# Patient Record
Sex: Female | Born: 1955
Health system: Southern US, Community
[De-identification: ages and names within clinical notes are randomized; demographics above are authoritative.]

## PROBLEM LIST (undated history)

## (undated) DIAGNOSIS — E041 Nontoxic single thyroid nodule: Secondary | ICD-10-CM

## (undated) DIAGNOSIS — K589 Irritable bowel syndrome without diarrhea: Secondary | ICD-10-CM

## (undated) DIAGNOSIS — J309 Allergic rhinitis, unspecified: Secondary | ICD-10-CM

## (undated) DIAGNOSIS — G4700 Insomnia, unspecified: Secondary | ICD-10-CM

## (undated) DIAGNOSIS — C55 Malignant neoplasm of uterus, part unspecified: Secondary | ICD-10-CM

## (undated) DIAGNOSIS — K610 Anal abscess: Secondary | ICD-10-CM

## (undated) HISTORY — DX: Anal abscess: K61.0

## (undated) HISTORY — DX: Insomnia, unspecified: G47.00

## (undated) HISTORY — PX: ABDOMINAL HYSTERECTOMY: SHX81

## (undated) HISTORY — DX: Irritable bowel syndrome, unspecified: K58.9

## (undated) HISTORY — DX: Nontoxic single thyroid nodule: E04.1

## (undated) HISTORY — DX: Malignant neoplasm of uterus, part unspecified: C55

## (undated) HISTORY — PX: COLONOSCOPY: SHX174

## (undated) HISTORY — DX: Allergic rhinitis, unspecified: J30.9

## (undated) HISTORY — PX: HEMORRHOID SURGERY: SHX153

---

## 1999-05-03 ENCOUNTER — Ambulatory Visit (HOSPITAL_COMMUNITY): Admission: RE | Admit: 1999-05-03 | Discharge: 1999-05-03 | Payer: Self-pay | Admitting: Internal Medicine

## 1999-05-03 ENCOUNTER — Encounter: Payer: Self-pay | Admitting: Internal Medicine

## 2000-04-06 ENCOUNTER — Encounter (INDEPENDENT_AMBULATORY_CARE_PROVIDER_SITE_OTHER): Payer: Self-pay | Admitting: Specialist

## 2000-04-06 ENCOUNTER — Ambulatory Visit (HOSPITAL_COMMUNITY): Admission: RE | Admit: 2000-04-06 | Discharge: 2000-04-06 | Payer: Self-pay | Admitting: General Surgery

## 2000-06-28 ENCOUNTER — Other Ambulatory Visit: Admission: RE | Admit: 2000-06-28 | Discharge: 2000-06-28 | Payer: Self-pay | Admitting: *Deleted

## 2000-10-27 ENCOUNTER — Encounter (INDEPENDENT_AMBULATORY_CARE_PROVIDER_SITE_OTHER): Payer: Self-pay | Admitting: Specialist

## 2000-10-27 ENCOUNTER — Ambulatory Visit (HOSPITAL_COMMUNITY): Admission: RE | Admit: 2000-10-27 | Discharge: 2000-10-27 | Payer: Self-pay | Admitting: General Surgery

## 2001-11-09 ENCOUNTER — Other Ambulatory Visit: Admission: RE | Admit: 2001-11-09 | Discharge: 2001-11-09 | Payer: Self-pay | Admitting: Obstetrics & Gynecology

## 2002-09-25 ENCOUNTER — Other Ambulatory Visit: Admission: RE | Admit: 2002-09-25 | Discharge: 2002-09-25 | Payer: Self-pay | Admitting: Pediatrics

## 2002-10-02 ENCOUNTER — Other Ambulatory Visit: Admission: RE | Admit: 2002-10-02 | Discharge: 2002-10-02 | Payer: Self-pay | Admitting: Diagnostic Radiology

## 2002-11-06 ENCOUNTER — Other Ambulatory Visit: Admission: RE | Admit: 2002-11-06 | Discharge: 2002-11-06 | Payer: Self-pay | Admitting: Obstetrics & Gynecology

## 2003-02-23 ENCOUNTER — Emergency Department (HOSPITAL_COMMUNITY): Admission: EM | Admit: 2003-02-23 | Discharge: 2003-02-23 | Payer: Self-pay | Admitting: Emergency Medicine

## 2003-07-22 ENCOUNTER — Encounter: Admission: RE | Admit: 2003-07-22 | Discharge: 2003-07-22 | Payer: Self-pay | Admitting: Internal Medicine

## 2006-11-16 ENCOUNTER — Encounter: Admission: RE | Admit: 2006-11-16 | Discharge: 2007-02-06 | Payer: Self-pay | Admitting: Obstetrics and Gynecology

## 2007-03-01 HISTORY — PX: CARPAL TUNNEL RELEASE: SHX101

## 2008-12-15 ENCOUNTER — Ambulatory Visit: Payer: Self-pay | Admitting: Internal Medicine

## 2008-12-29 ENCOUNTER — Telehealth (INDEPENDENT_AMBULATORY_CARE_PROVIDER_SITE_OTHER): Payer: Self-pay | Admitting: *Deleted

## 2010-07-16 NOTE — Op Note (Signed)
[facility]  Patient:    [name], [name]                MRN: [id number] Proc. Date: 04/06/00 Adm. Date:  [id number] Attending:  Carson Myrtle                           Operative Report  PREOPERATIVE DIAGNOSIS:  Internal and external hemorrhoids.  POSTOPERATIVE DIAGNOSIS:  Internal and external hemorrhoids.  OPERATION:  Simple hemorrhoidectomy.  SURGEON:  Timothy E. Earlene Plater, M.D.  ANESTHESIA:  Local with standby.  INDICATIONS:  Ms. Perlie Gold has a longstanding protruding hemorrhoid in the right anterior position causing pain and bleeding and she wishes to have that removed.  I carefully discussed in particular the anesthetic.  S  DESCRIPTION OF PROCEDURE:  She was prepared at home, brought into the hospital this morning, taken to room #1, placed supine, IV started and sedation given. She was then placed in the lithotomy.  Perineal area inspected.  She was prepped and draped in the usual fashion.  A single protruding hemorrhoid was present in the right anterior position.  This was injected locally with 0.25% Marcaine with epinephrine, mixed 9:1 with [place].  This was massaged in well and then the hemorrhoidal complex was removed in its entirety.  The wound was closed with carefully applied 2-0 chromic, closing the wound across the rectal mucosa, anoderm and skin.  There was no bleeding or complication.  The sphincter was intact.  There was no evidence of pathology.  The procedure was complete.  Counts were correct.  Gelfoam and dry sterile dressing were applied.  She tolerated it well and was removed to the recovery room in good condition.  Written and verbal instructions were given including Percocet 5/325 #24 and she will be followed as an outpatient.  DD:  04/06/00 TD:  04/06/00 Job: [id number]

## 2010-07-16 NOTE — Op Note (Signed)
[facility]  Patient:    [name], [name] Visit Number: [id number] MRN: [id number]          Service Type: DSU Location: DAY Attending Physician:  Carson Myrtle Proc. Date: 10/27/00 Admit Date:  10/27/2000                             Operative Report  PREOPERATIVE DIAGNOSIS:  Anal abscess.  POSTOPERATIVE DIAGNOSIS:  Anal abscess.  PROCEDURE:  Debridement of anal abscess.  SURGEON:  Timothy E. Earlene Plater, M.D.  ANESTHESIA:  Local standby  INDICATIONS:  Ms. [name] had a simple hemorrhoidectomy in February. Postoperatively she developed a perianal abscess that was treated conservatively, and she has delayed surgical treatment until this time.  She agrees and understands the procedure.  DESCRIPTION OF PROCEDURE:  She was taken to the operating room, placed supine, IV sedation given.  Placed in lithotomy.  The perianal area was inspected and prepped and draped in the usual fashion.  Marcaine 0.25% with epinephrine was used for local anesthesia.  The area of concern was the right anterior where the previous hemorrhoidectomy had been done.  There was a small intra-anal abscess.  This area was debrided.  The skin edges and mucosa were cut away, and the base then cauterized.  There was no fistula.  This completed the procedure.  Gelfoam, gauze, and a dry sterile dressing applied.  She tolerated it well and was moved to the recovery room in good condition.  Written and verbal instructions, including Percocet 5 mg #36 were given.  She will be followed as an outpatient. Attending Physician:  Carson Myrtle DD:  10/27/00 TD:  10/27/00 Job: [id number]

## 2010-08-04 ENCOUNTER — Telehealth: Payer: Self-pay | Admitting: Internal Medicine

## 2010-08-04 DIAGNOSIS — E041 Nontoxic single thyroid nodule: Secondary | ICD-10-CM

## 2010-08-04 NOTE — Telephone Encounter (Signed)
She needs to see Dr Felicity Coyer after 6/28 - pls sch appt (OK with Dr L.). The pt is in [place] now... Sch thyroid US Dx nodule - done Thx

## 2010-08-05 NOTE — Telephone Encounter (Signed)
Please set pt up for apt w/Dr Felicity Coyer after 6/28 (see note below)

## 2010-08-31 ENCOUNTER — Other Ambulatory Visit: Payer: Self-pay

## 2010-09-02 ENCOUNTER — Other Ambulatory Visit: Payer: Self-pay | Admitting: Internal Medicine

## 2010-09-02 ENCOUNTER — Ambulatory Visit (INDEPENDENT_AMBULATORY_CARE_PROVIDER_SITE_OTHER): Payer: BC Managed Care – PPO | Admitting: Endocrinology

## 2010-09-02 ENCOUNTER — Ambulatory Visit (INDEPENDENT_AMBULATORY_CARE_PROVIDER_SITE_OTHER): Payer: BC Managed Care – PPO | Admitting: Internal Medicine

## 2010-09-02 ENCOUNTER — Encounter: Payer: Self-pay | Admitting: Internal Medicine

## 2010-09-02 ENCOUNTER — Other Ambulatory Visit (INDEPENDENT_AMBULATORY_CARE_PROVIDER_SITE_OTHER): Payer: BC Managed Care – PPO

## 2010-09-02 VITALS — BP 140/92 | HR 90 | Temp 98.5°F | Ht 68.0 in | Wt 168.0 lb

## 2010-09-02 DIAGNOSIS — E041 Nontoxic single thyroid nodule: Secondary | ICD-10-CM | POA: Insufficient documentation

## 2010-09-02 DIAGNOSIS — F32A Depression, unspecified: Secondary | ICD-10-CM | POA: Insufficient documentation

## 2010-09-02 DIAGNOSIS — J45909 Unspecified asthma, uncomplicated: Secondary | ICD-10-CM | POA: Insufficient documentation

## 2010-09-02 DIAGNOSIS — J309 Allergic rhinitis, unspecified: Secondary | ICD-10-CM | POA: Insufficient documentation

## 2010-09-02 DIAGNOSIS — C55 Malignant neoplasm of uterus, part unspecified: Secondary | ICD-10-CM | POA: Insufficient documentation

## 2010-09-02 DIAGNOSIS — Z0000 Encounter for general adult medical examination without abnormal findings: Secondary | ICD-10-CM

## 2010-09-02 DIAGNOSIS — L989 Disorder of the skin and subcutaneous tissue, unspecified: Secondary | ICD-10-CM | POA: Insufficient documentation

## 2010-09-02 DIAGNOSIS — F419 Anxiety disorder, unspecified: Secondary | ICD-10-CM | POA: Insufficient documentation

## 2010-09-02 DIAGNOSIS — K610 Anal abscess: Secondary | ICD-10-CM | POA: Insufficient documentation

## 2010-09-02 DIAGNOSIS — G4700 Insomnia, unspecified: Secondary | ICD-10-CM | POA: Insufficient documentation

## 2010-09-02 DIAGNOSIS — E559 Vitamin D deficiency, unspecified: Secondary | ICD-10-CM | POA: Insufficient documentation

## 2010-09-02 DIAGNOSIS — K649 Unspecified hemorrhoids: Secondary | ICD-10-CM | POA: Insufficient documentation

## 2010-09-02 HISTORY — DX: Insomnia, unspecified: G47.00

## 2010-09-02 HISTORY — DX: Nontoxic single thyroid nodule: E04.1

## 2010-09-02 HISTORY — DX: Allergic rhinitis, unspecified: J30.9

## 2010-09-02 HISTORY — DX: Malignant neoplasm of uterus, part unspecified: C55

## 2010-09-02 HISTORY — DX: Anal abscess: K61.0

## 2010-09-02 LAB — CBC WITH DIFFERENTIAL/PLATELET
Basophils Absolute: 0 10*3/uL (ref 0.0–0.1)
Eosinophils Relative: 1.8 % (ref 0.0–5.0)
HCT: 42.6 % (ref 36.0–46.0)
Hemoglobin: 14.9 g/dL (ref 12.0–15.0)
Lymphocytes Relative: 27.1 % (ref 12.0–46.0)
Lymphs Abs: 2.5 10*3/uL (ref 0.7–4.0)
Monocytes Relative: 7.8 % (ref 3.0–12.0)
Neutro Abs: 5.7 10*3/uL (ref 1.4–7.7)
Platelets: 230 10*3/uL (ref 150.0–400.0)
RDW: 13.4 % (ref 11.5–14.6)
WBC: 9.1 10*3/uL (ref 4.5–10.5)

## 2010-09-02 LAB — BASIC METABOLIC PANEL
BUN: 30 mg/dL — ABNORMAL HIGH (ref 6–23)
Calcium: 8.9 mg/dL (ref 8.4–10.5)
GFR: 80.39 mL/min (ref >60.00)
Glucose, Bld: 101 mg/dL — ABNORMAL HIGH (ref 70–99)
Potassium: 4.4 mEq/L (ref 3.5–5.1)

## 2010-09-02 LAB — HEPATIC FUNCTION PANEL
AST: 21 U/L (ref 0–37)
Albumin: 4.3 g/dL (ref 3.5–5.2)

## 2010-09-02 LAB — LIPID PANEL
HDL: 50.5 mg/dL (ref >39.00)
Triglycerides: 627 mg/dL — ABNORMAL HIGH (ref 0.0–149.0)

## 2010-09-02 LAB — URINALYSIS, ROUTINE W REFLEX MICROSCOPIC
Leukocytes, UA: NEGATIVE
Specific Gravity, Urine: >=1.03 (ref 1.000–1.030)
Urobilinogen, UA: 0.2 (ref 0.0–1.0)
pH: 5.5 (ref 5.0–8.0)

## 2010-09-02 LAB — TSH: TSH: 1.3 u[IU]/mL (ref 0.35–5.50)

## 2010-09-02 MED ORDER — ZOLPIDEM TARTRATE 10 MG PO TABS: 10.0000 mg | ORAL_TABLET | Freq: Every evening | ORAL | Status: AC | PRN

## 2010-09-02 MED ORDER — METHYLPREDNISOLONE ACETATE 80 MG/ML IJ SUSP
120.0000 mg | Freq: Once | INTRAMUSCULAR | Status: AC
  Administered 2010-09-02: 120 mg via INTRAMUSCULAR

## 2010-09-02 MED ORDER — AZELASTINE HCL 0.1 % NA SOLN: 1.0000 | Freq: Two times a day (BID) | NASAL | Status: AC

## 2010-09-02 MED ORDER — DESLORATADINE 5 MG PO TABS: 5.0000 mg | ORAL_TABLET | Freq: Two times a day (BID) | ORAL | Status: AC

## 2010-09-02 MED ORDER — SERTRALINE HCL 50 MG PO TABS: 50.0000 mg | ORAL_TABLET | Freq: Every day | ORAL | Status: AC

## 2010-09-02 MED ORDER — FLUTICASONE-SALMETEROL 250-50 MCG/DOSE IN AEPB: 1.0000 | INHALATION_SPRAY | Freq: Two times a day (BID) | RESPIRATORY_TRACT | Status: DC

## 2010-09-02 MED ORDER — NAPROXEN 500 MG PO TABS: 500.0000 mg | ORAL_TABLET | Freq: Two times a day (BID) | ORAL | Status: AC

## 2010-09-02 MED ORDER — PREDNISONE 10 MG PO TABS: 10.0000 mg | ORAL_TABLET | Freq: Every day | ORAL | Status: AC

## 2010-09-02 MED ORDER — DESLORATADINE 5 MG PO TABS: 5.0000 mg | ORAL_TABLET | Freq: Two times a day (BID) | ORAL | Status: DC

## 2010-09-02 NOTE — Assessment & Plan Note (Signed)

## 2010-09-02 NOTE — Assessment & Plan Note (Signed)
Has already been arranged for u/s f/u per Dr Posey Rea

## 2010-09-02 NOTE — Progress Notes (Signed)
Subjective:    Patient ID: [name], female    DOB: Jan 02, 1956, 55 y.o.   MRN: [id number]  HPI  Pt previously of Dr Posey Rea here to re-stablish;  Has been living in [place] for over 3 yrs, and back here in the [place] for only 4 wks.  Has mult concerns as she is trying to address all she can before she goes back;  After discussion she plans to see her GYN for pap/mammogram/dx.  Does have several wks ongoing flare of nasal allergy symptoms with clear congestion, itch and sneeze, without fever, pain, ST, cough or wheezing.  Has mild bilat medial knee discomfort, worse to going down stairs, without swelling, giveaway or falls , seems some worse as she has gained some wt.  Also works as Contractor using the right hand, and has mild intermittent discomfort to the right wrist without swelling and clearly is different from the CTS type pain she has had in the past.  Asthma has been stable - Pt denies chest pain, increased sob or doe, wheezing, orthopnea, PND, increased LE swelling, palpitations, dizziness or syncope.  Pt denies new neurological symptoms such as new headache, or facial or extremity weakness or numbness   Pt denies polydipsia, polyuria.  Has known right thyroid nodule s/p 2 neg biopsy in the past and thinks she may have a mass near the nodule or the nodule is getting larger  Asks for labs including Vit D as well.   Pt states overall good compliance with treatment and medications, good tolerability, and trying to follow lower cholesterol diet.  Pt denies worsening depressive symptoms, suicidal ideation or panic. No fever, wt loss, night sweats, loss of appetite, or other constitutional symptoms.  Pt states good ability with ADL's, low fall risk, home safety reviewed and adequate, no significant changes in hearing or vision, and occasionally active with exercise. Past Medical History  Diagnosis Date  . Asthma 09/02/2010  . Allergic rhinitis, cause unspecified 09/02/2010  . Hemorrhoids 09/02/2010    . Anal abscess 09/02/2010  . Thyroid nodule 09/02/2010  . Uterine cancer 09/02/2010  . Vitamin D deficiency 09/02/2010  . Insomnia 09/02/2010   Past Surgical History  Procedure Date  . Carpal tunnel release 2009  . Abdominal hysterectomy     reports that she has never smoked. She does not have any smokeless tobacco history on file. Her alcohol and drug histories not on file. family history is not on file. No Known Allergies No current outpatient prescriptions on file prior to visit.   No current facility-administered medications on file prior to visit.   Review of Systems Review of Systems  Constitutional: Negative for diaphoresis, activity change, appetite change and unexpected weight change.  HENT: Negative for hearing loss, ear pain, facial swelling, mouth sores and neck stiffness.   Eyes: Negative for pain, redness and visual disturbance.  Respiratory: Negative for shortness of breath and wheezing.   Cardiovascular: Negative for chest pain and palpitations.  Gastrointestinal: Negative for diarrhea, blood in stool, abdominal distention and rectal pain.  Genitourinary: Negative for hematuria, flank pain and decreased urine volume.  Musculoskeletal: Negative for myalgias and joint swelling.  Skin: Negative for color change and wound.  Neurological: Negative for syncope and numbness.  Hematological: Negative for adenopathy.  Psychiatric/Behavioral: Negative for hallucinations, self-injury, decreased concentration and agitation.      Objective:   Physical Exam BP 140/92  Pulse 90  Temp(Src) 98.5 F (36.9 C) (Oral)  Ht 5\' 8"  (1.727 m)  Wt 168  lb (76.204 kg)  BMI 25.54 kg/m2  SpO2 97% Physical Exam  VS noted Constitutional: Pt is oriented to person, place, and time. Appears well-developed and well-nourished.  HENT:  Head: Normocephalic and atraumatic.  Right Ear: External ear normal.  Left Ear: External ear normal.  Nose: Nose normal.  Mouth/Throat: Oropharynx is clear and  moist.  Eyes: Conjunctivae and EOM are normal. Pupils are equal, round, and reactive to light.  Neck: Normal range of motion. Neck supple. No JVD present. No tracheal deviation present. I dont feel mass , but has small right thyroid nodule Cardiovascular: Normal rate, regular rhythm, normal heart sounds and intact distal pulses.   Pulmonary/Chest: Effort normal and breath sounds normal.  Abdominal: Soft. Bowel sounds are normal. There is no tenderness.  Musculoskeletal: Normal range of motion. Exhibits no edema.  Has mild medial bony DJD changes, nontender, knees o/w FROM, NT, no effusion Right wrist and hand nontender, FROM, no swelling, rash Lymphadenopathy:  Has no cervical adenopathy.  Neurological: Pt is alert and oriented to person, place, and time. Pt has normal reflexes. No cranial nerve deficit.  Skin: Skin is warm and dry. No rash noted.  Psychiatric:  Has  normal mood and affect. Behavior is normal. 1+ nervous        Assessment & Plan:

## 2010-09-02 NOTE — Patient Instructions (Signed)
You had the steroid shot today Take all new medications as prescribed - the prednisone, as well as the anti-inflammatory for the wrist Continue all other medications as before - you are given the refills Please go to LAB in the Basement for the blood and/or urine tests to be done today, including the Vitamin D Please call the phone number [phone] (the PhoneTree System) for results of testing in 2-3 days;  When calling, simply dial the number, and when prompted enter the MRN number above (the Medical Record Number) and the # key, then the message should start. You will be contacted regarding the referral for: Dr Everardo All - to be seen today Please keep your appointments with your test as you have planned - the thyroid ultrasound Please remember to followup with your GYN for the yearly pap smear and/or mammogram, and the bone density You will be contacted regarding the referral for: dermatology Please call if you wish to be referred to Hand Surgury Xrays and colonoscopy not needed today

## 2010-09-02 NOTE — Assessment & Plan Note (Signed)
With marked symtpoms -for depomedrol IM, and predpack for home

## 2010-09-02 NOTE — Progress Notes (Signed)
Subjective:    Patient ID: [name], female    DOB: 07/28/1955, 55 y.o.   MRN: [id number]  HPI Pt has 16-year h/o right thyroid nodule.  She has had bx done at least twice.  The most recent was approx 5 year ago.  She has 1 year of slight pain at the knees, but no assoc numbness.   Past Medical History  Diagnosis Date  . Asthma 09/02/2010  . Allergic rhinitis, cause unspecified 09/02/2010  . Hemorrhoids 09/02/2010  . Anal abscess 09/02/2010  . Thyroid nodule 09/02/2010  . Uterine cancer 09/02/2010  . Vitamin D deficiency 09/02/2010    Past Surgical History  Procedure Date  . Carpal tunnel release 2009  . Abdominal hysterectomy     History   Social History  . Marital Status: Married    Spouse Name: N/A    Number of Children: N/A  . Years of Education: N/A   Occupational History  . Not on file.   Social History Main Topics  . Smoking status: Never Smoker   . Smokeless tobacco: Not on file  . Alcohol Use: Not on file  . Drug Use: Not on file  . Sexually Active: Not on file   Other Topics Concern  . Not on file   Social History Narrative  . No narrative on file  lives in [place]  Current Outpatient Prescriptions on File Prior to Visit  Medication Sig Dispense Refill  . azelastine (ASTELIN) 137 MCG/SPRAY nasal spray Place 1 spray into the nose 2 (two) times daily. Use in each nostril as directed  30 mL  11  . desloratadine (CLARINEX) 5 MG tablet Take 1 tablet (5 mg total) by mouth 2 (two) times daily.  30 tablet  11  . Fluticasone-Salmeterol (ADVAIR DISKUS) 250-50 MCG/DOSE AEPB Inhale 1 puff into the lungs 2 (two) times daily.  60 each  11  . sertraline (ZOLOFT) 50 MG tablet Take 1 tablet (50 mg total) by mouth daily.  30 tablet  11  . naproxen (NAPROSYN) 500 MG tablet Take 1 tablet (500 mg total) by mouth 2 (two) times daily with a meal.  60 tablet  5  . predniSONE (DELTASONE) 10 MG tablet Take 1 tablet (10 mg total) by mouth daily. 3 tabs by mouth per day for 3 days,  then 2 tabs per day for 3 days, then 1 tab per day for 3 days, then stop   18 tablet  0  . DISCONTD: azelastine (ASTELIN) 137 MCG/SPRAY nasal spray Place 1 spray into the nose 2 (two) times daily. Use in each nostril as directed       . DISCONTD: desloratadine (CLARINEX) 5 MG tablet Take 5 mg by mouth 2 (two) times daily.        Marland Kitchen DISCONTD: desloratadine (CLARINEX) 5 MG tablet Take 1 tablet (5 mg total) by mouth 2 (two) times daily.  30 tablet  11  . DISCONTD: Fluticasone-Salmeterol (ADVAIR DISKUS) 250-50 MCG/DOSE AEPB Inhale 1 puff into the lungs 2 (two) times daily.        Marland Kitchen DISCONTD: sertraline (ZOLOFT) 50 MG tablet Take 50 mg by mouth daily.         Current Facility-Administered Medications on File Prior to Visit  Medication Dose Route Frequency Provider Last Rate Last Dose  . methylPREDNISolone acetate (DEPO-MEDROL) injection 120 mg  120 mg Intramuscular Once Oliver Barre, MD   120 mg at 09/02/10 1604    No Known Allergies  No family history on file.  There were no vitals taken for this visit.  Review of Systems denies hoarseness, double vision, palpitations, sob, diarrhea, polyuria, myalgias, tremor, anxiety, hypoglycemia, and easy bruising.  She reports weight gain, headache, diaphoresis, rhinorrhea, and "hot flashes."      Objective:   Physical Exam VS: see vs page GEN: no distress HEAD: head: no deformity eyes: no periorbital swelling, no proptosis external nose and ears are normal. NECK: supple, thyroid is not enlarged, but there is a 2 cm right nodule.  i do not appreciate any other nodule. CHEST WALL: no deformity MUSCULOSKELETAL: muscle bulk and strength are grossly normal.  no obvious joint swelling.  gait is normal and steady EXTEMITIES: no deformity. no edema NEURO:  cn 2-12 grossly intact.   readily moves all 4's.   SKIN:  Normal texture and temperature.  No rash or suspicious lesion is visible.   NODES:  None palpable at the neck PSYCH: alert, oriented x3.  Does  not appear anxious nor depressed.    Assessment & Plan:  Right thyroid nodule, uncertain etiology.  Malignancy is unlikely. Knee pain, not thyroid-related. Weight gain, and other sxs, not thyroid-related

## 2010-09-02 NOTE — Assessment & Plan Note (Signed)
Mild difficultly getting to sleep - tx with ambien prn,  to f/u any worsening symptoms or concerns

## 2010-09-02 NOTE — Patient Instructions (Addendum)
blood tests, and an ultrasound, are being ordered for you today.  please call [phone] to hear each of your test results.  You will be prompted to enter the 9-digit "MRN" number that appears at the top left of this page, followed by #.  Then you will hear the message. Please return in 1 year.

## 2010-09-02 NOTE — Assessment & Plan Note (Addendum)
For f/u today, consider vit d 1000 qd otc

## 2010-09-02 NOTE — Telephone Encounter (Signed)
Pt was scheduled for u/s 7/3- she just returned to town. Gave pt # to [facility] [phone] to reschedule test. She also c/o allergies since [place] states. Her allergist has not seen her x 2 years and will not w/o referral. She "knows" she needs steriod shot. Scheduled apt w/avail MD, Dr Jonny Ruiz, this pm - OK per Dr Posey Rea.   Pt has not been seen by Dr Posey Rea in centricity OR epic. Attempting to get paper chart to our office. ? Possibly at [facility] office.

## 2010-09-02 NOTE — Assessment & Plan Note (Addendum)
Mother died with skin cancer - pt requests derm referral, has benign lesion right medial thigh noted today

## 2010-09-03 ENCOUNTER — Ambulatory Visit
Admission: RE | Admit: 2010-09-03 | Discharge: 2010-09-03 | Disposition: A | Discharge status: Home / Self Care | Payer: BC Managed Care – PPO | Source: Ambulatory Visit | Attending: Internal Medicine | Admitting: Internal Medicine

## 2010-09-03 LAB — VITAMIN D 25 HYDROXY (VIT D DEFICIENCY, FRACTURES): Vit D, 25-Hydroxy: 31 ng/mL (ref 30–89)

## 2010-09-03 NOTE — Progress Notes (Signed)
Quick Note:  Voice message left on PhoneTree system - lab is negative, normal or otherwise stable, pt to continue same tx ______ 

## 2010-09-07 ENCOUNTER — Telehealth: Payer: Self-pay

## 2010-09-07 NOTE — Telephone Encounter (Signed)
Called patient; left message to call back.

## 2010-09-07 NOTE — Telephone Encounter (Signed)
Message copied by Pincus Sanes on Tue Sep 07, 2010  5:01 PM ------      Message from: Corwin Levins      Created: Tue Sep 07, 2010 12:54 PM       The way the test is done now, fasting makes little to no difference;  The real difference comes with dietary change and takes 2-4 wks to accomplish at minimum;  That's why we wait for 3 months      ----- Message -----         From: Scharlene Gloss         Sent: 09/07/2010   8:20 AM           To: Oliver Barre, MD            Called the patient informed of MD's instructions to wait 3 months. The patient does not feel she should have to pay to repeat blood work as should have been informed to fast before the previous labs.

## 2010-09-08 ENCOUNTER — Telehealth: Payer: Self-pay

## 2010-09-08 NOTE — Telephone Encounter (Signed)
Message copied by Pincus Sanes on Wed Sep 08, 2010  8:02 AM ------      Message from: Corwin Levins      Created: Tue Sep 07, 2010 12:54 PM       The way the test is done now, fasting makes little to no difference;  The real difference comes with dietary change and takes 2-4 wks to accomplish at minimum;  That's why we wait for 3 months      ----- Message -----         From: Scharlene Gloss         Sent: 09/07/2010   8:20 AM           To: Oliver Barre, MD            Called the patient informed of MD's instructions to wait 3 months. The patient does not feel she should have to pay to repeat blood work as should have been informed to fast before the previous labs.

## 2010-09-08 NOTE — Telephone Encounter (Signed)
The patient does refuse to go on medication at this time as feels the test were not correct based on the fact she had eaten a heavy meal 2 hours previously. She is going to repeat the test in a couple weeks at [facility]. Will call back with those results to compare. If results are still high she would consider medication at that time, but will hold for now.

## 2010-09-08 NOTE — Telephone Encounter (Signed)
noted 

## 2010-09-27 ENCOUNTER — Telehealth: Payer: Self-pay | Admitting: *Deleted

## 2010-09-27 NOTE — Telephone Encounter (Signed)
To forward to Dr Ellison-  

## 2010-09-27 NOTE — Telephone Encounter (Signed)
Patient would like to get the lab results from her 09/02/10 OV with Dr Everardo All and also her [place] results done on 09/03/10. Patient is leaving in 10 days for [place] & will not be back to [place] for 1 year.

## 2010-09-27 NOTE — Telephone Encounter (Signed)
Pt left another message req results. U/S results do not look like they have been signed off on and there are no notations that patient has been given results. OK to just give patient copy of report?

## 2010-09-27 NOTE — Telephone Encounter (Signed)
Please direct request to med records 

## 2010-09-27 NOTE — Telephone Encounter (Signed)
Please direct this request to med records

## 2010-09-28 NOTE — Telephone Encounter (Signed)
Pt advised to requests from Medical Records but if she is requesting results to call back.

## 2010-09-29 ENCOUNTER — Telehealth: Payer: Self-pay | Admitting: Internal Medicine

## 2010-09-29 NOTE — Telephone Encounter (Signed)
I need assistance with this please. The patient is upset because she is asking for results, not copies of her [place] of her thyroid. Patient was never left any results on the phone tree and never received a phone call with her [place] results. Please advise patient today as she will be leaving to go out of the country. Thanks.

## 2010-09-29 NOTE — Telephone Encounter (Signed)
No significant change in the thyroid.  A repeat ultrasound is recommended in 1 year

## 2010-10-13 ENCOUNTER — Ambulatory Visit: Payer: BC Managed Care – PPO

## 2010-10-13 ENCOUNTER — Other Ambulatory Visit: Payer: BC Managed Care – PPO

## 2010-10-13 DIAGNOSIS — E7800 Pure hypercholesterolemia, unspecified: Secondary | ICD-10-CM

## 2010-10-13 LAB — LIPID PANEL
LDL Cholesterol: 112 mg/dL — ABNORMAL HIGH (ref 0–99)
Total CHOL/HDL Ratio: 3

## 2010-11-02 ENCOUNTER — Telehealth: Payer: Self-pay

## 2010-11-02 MED ORDER — TOBRAMYCIN 0.3 % OP SOLN: 1.0000 [drp] | Freq: Four times a day (QID) | OPHTHALMIC | Status: AC

## 2010-11-02 NOTE — Telephone Encounter (Signed)
Done per emr 

## 2010-11-02 NOTE — Telephone Encounter (Signed)
Pt called stating she developed pink eye over the weekend. Pt is requesting Rx for ABX eye drop to pharmacy

## 2010-11-02 NOTE — Telephone Encounter (Signed)
Pt informed

## 2010-11-24 ENCOUNTER — Other Ambulatory Visit: Payer: Self-pay | Admitting: Internal Medicine

## 2010-11-24 DIAGNOSIS — Z1231 Encounter for screening mammogram for malignant neoplasm of breast: Secondary | ICD-10-CM

## 2010-12-08 ENCOUNTER — Ambulatory Visit: Payer: BC Managed Care – PPO

## 2010-12-23 ENCOUNTER — Ambulatory Visit: Payer: BC Managed Care – PPO

## 2010-12-24 ENCOUNTER — Encounter: Payer: Self-pay | Admitting: Internal Medicine

## 2010-12-28 ENCOUNTER — Encounter: Payer: Self-pay | Admitting: Internal Medicine

## 2011-02-16 ENCOUNTER — Ambulatory Visit (INDEPENDENT_AMBULATORY_CARE_PROVIDER_SITE_OTHER): Payer: BC Managed Care – PPO | Admitting: *Deleted

## 2011-02-16 DIAGNOSIS — Z23 Encounter for immunization: Secondary | ICD-10-CM

## 2011-07-05 ENCOUNTER — Encounter: Payer: Self-pay | Admitting: Internal Medicine

## 2011-07-05 ENCOUNTER — Ambulatory Visit (INDEPENDENT_AMBULATORY_CARE_PROVIDER_SITE_OTHER): Payer: BC Managed Care – PPO | Admitting: Internal Medicine

## 2011-07-05 VITALS — BP 112/78 | HR 69 | Ht 68.0 in | Wt 170.2 lb

## 2011-07-05 DIAGNOSIS — R143 Flatulence: Secondary | ICD-10-CM

## 2011-07-05 DIAGNOSIS — R141 Gas pain: Secondary | ICD-10-CM

## 2011-07-05 DIAGNOSIS — Z86010 Personal history of colonic polyps: Secondary | ICD-10-CM

## 2011-07-05 MED ORDER — ALIGN PO CAPS: 1.0000 | ORAL_CAPSULE | Freq: Every day | ORAL | Status: AC

## 2011-07-05 NOTE — Progress Notes (Addendum)
Subjective:    Patient ID: [name], female    DOB: [birth year], 56 y.o.   MRN: [id number]  HPI Is a very pleasant 56 year old white woman who tells me that in the past 3 months she is having increasing problems with gas and flatulence. She was living in working in [place] for the past 3 years with visits back to [place]. She did not have these problems then. She has been trying to increase the amount of vegetables in her diet and eat more fiber and healthy foods over the past several months. Despite this and exercising she has gained about 10 pounds. She says she has numerous episodes of flatulence and bloating problems, which cause issues at work and in social situations. Her son has reported he thinks her breath smells bad. She is wondering what the cause of this is. She tells me that her mother died of cancer at a young age, when the patient was 28, and also there was a delay in diagnosis of the patient's of endometrial cancer and she had to insist on biopsy. These experiences have caused her to try to understand any symptoms that occur with her.  Also note that she eats some but not excessive amounts of legumes or beans, but not much meat other than fish. She has not noted particular foods and give problems but does describe this dietary change and increasing vegetables, mainly green the fetus was like spinach and salads. She does not think she eats an increased amount of cruciferous vegetables like broccoli or cauliflower. She does have some constipation, she may skip a day, she has been chronically like this, there is no diarrhea. She had used a probiotic sometime in the last 6-8 months when she had yeast infections related to antibiotic use then, though there is no recent antibiotic use.  She is concerned about a thyroid nodule that she has had, she is due to go back to Dr. Everardo All for further evaluation and followup. There was a chronic thyroid nodule and a new smaller nodule on  ultrasound last summer. Her TSH was normal last summer.  A colonoscopy was performed sometime in the last 3 years, it sounds like by Dr. Loreta Ave and some polyps were removed. She has not yet received any type of bladder suggesting she is due for a colonoscopy.  Medications, allergies, past medical history, past surgical history, family history and social history are reviewed and updated in the EMR.   Review of Systems Energy level as reported is good, she does think she has some allergies and arthritis. The remainder of the review of systems is negative except for those things mentioned above in the history of present illness.    Objective:   Physical Exam General:  Well-developed, well-nourished and in no acute distress Eyes:  anicteric. Neck:   supple w/o thyromegaly but ? Prominence of right lobe Lungs: Clear to auscultation bilaterally. Heart:  S1S2, no rubs, murmurs, gallops. Abdomen:  soft, non-tender, no hepatosplenomegaly, hernia, or mass and BS+.  Lymph:  no cervical or supraclavicular adenopathy. Extremities:   no edema Skin   no rash. Neuro:  A&O x 3.  Psych:  appropriate mood and  Affect.   Data Reviewed: Previous labs, Dr. George Hugh visit 2012, thyroid ultrasound 2012       Assessment & Plan:   1. Flatulence symptoms  I suspect this is mainly dietary related. She may have some underlying IBS. She should modify her diet, given her gas and flatulence prevention diet will  also try a probiotic on a daily basis using  Align for at least one month. She was given the option of going scheduling a followup and she prefers to followup as needed.   2. Personal history of colonic polyps   We'll request and review colonoscopy and pathology reports to determine when next routine colonoscopy is due and will notify her.     Colonoscopy and polypectomy was performed 12/06/2006. 2 small sessile polyps removed from the left colon. There was a smaller polyp ablated in the rectum. One  polyp was a tubular adenoma the other was a hyperplastic polyp. She also had small nonbleeding internal hemorrhoids and a normal terminal ileum.  Based upon this, repeat routine colonoscopy around October 2013 would make sense. We will notify the patient.

## 2011-07-05 NOTE — Patient Instructions (Addendum)
Today you have signed a records release for Dr. Loreta Ave.  If you have not heard back from [place] in 2 weeks give [place] a call back.  We gave you Align samples today, take one capsule every day.  If this works Engineer, production over the counter and continue to take this after samples run out.  You have been given a gas/flatulence prevention diet handout today to use as a guide.

## 2011-07-08 ENCOUNTER — Ambulatory Visit: Payer: BC Managed Care – PPO | Admitting: Endocrinology

## 2011-07-20 ENCOUNTER — Ambulatory Visit: Payer: BC Managed Care – PPO | Admitting: Endocrinology

## 2011-07-20 DIAGNOSIS — Z0289 Encounter for other administrative examinations: Secondary | ICD-10-CM

## 2011-08-03 ENCOUNTER — Telehealth: Payer: Self-pay

## 2011-08-03 NOTE — Telephone Encounter (Signed)
Message copied by [place], Nani Ingram E on Wed Aug 03, 2011  2:37 PM ------      Message from: Iva Boop      Created: Tue Aug 02, 2011  2:27 PM      Regarding: colon recall       Please let her know I have reviewed 12/06/2006 colonoscopy and pathology reports.      Routine repeat colonoscopy is due in October though could do this summer if she wants - close enough. Could also wait til later in year.            Schedule or place recall as appropriate please.

## 2011-08-03 NOTE — Telephone Encounter (Signed)
Pt notified that Dr. Leone Payor reviewed the 2008 colon and path reports.  She would like to do her routine colon in Oct. So recall put in accordingly.  She wants copy of these reports mailed to her.  Soon as they get scanned in I will mail them to her.

## 2011-08-10 ENCOUNTER — Other Ambulatory Visit: Payer: Self-pay

## 2011-10-13 ENCOUNTER — Ambulatory Visit (HOSPITAL_BASED_OUTPATIENT_CLINIC_OR_DEPARTMENT_OTHER)
Admission: RE | Admit: 2011-10-13 | Discharge: 2011-10-13 | Disposition: A | Discharge status: Home / Self Care | Payer: BC Managed Care – PPO | Source: Ambulatory Visit | Attending: Plastic Surgery | Admitting: Plastic Surgery

## 2011-10-13 ENCOUNTER — Encounter (HOSPITAL_BASED_OUTPATIENT_CLINIC_OR_DEPARTMENT_OTHER): Payer: Self-pay | Admitting: *Deleted

## 2011-10-13 ENCOUNTER — Encounter (HOSPITAL_BASED_OUTPATIENT_CLINIC_OR_DEPARTMENT_OTHER)
Admission: RE | Disposition: A | Discharge status: Home / Self Care | Payer: Self-pay | Source: Ambulatory Visit | Attending: Plastic Surgery

## 2011-10-13 DIAGNOSIS — L905 Scar conditions and fibrosis of skin: Secondary | ICD-10-CM | POA: Insufficient documentation

## 2011-10-13 DIAGNOSIS — C44711 Basal cell carcinoma of skin of unspecified lower limb, including hip: Secondary | ICD-10-CM | POA: Insufficient documentation

## 2011-10-13 HISTORY — PX: LESION REMOVAL: SHX5196

## 2011-10-13 SURGERY — MINOR EXCISION OF LESION
Anesthesia: LOCAL | Site: Leg Lower | Laterality: Left | Wound class: Clean

## 2011-10-13 MED ORDER — LIDOCAINE-EPINEPHRINE 1 %-1:100000 IJ SOLN
INTRAMUSCULAR | Status: DC | PRN
  Administered 2011-10-13: 46 mL

## 2011-10-13 SURGICAL SUPPLY — 42 items
BANDAGE ADHESIVE 1X3 (GAUZE/BANDAGES/DRESSINGS) IMPLANT
BENZOIN TINCTURE PRP APPL 2/3 (GAUZE/BANDAGES/DRESSINGS) ×4 IMPLANT
BLADE HEX COATED 2.75 (ELECTRODE) IMPLANT
BLADE SURG 15 STRL LF DISP TIS (BLADE) ×3 IMPLANT
BLADE SURG 15 STRL SS (BLADE) ×3
BLADE SURG ROTATE 9660 (MISCELLANEOUS) IMPLANT
CAUTERY EYE LOW TEMP 1300F FIN (OPHTHALMIC RELATED) IMPLANT
CONT SPEC 4OZ CLIKSEAL STRL BL (MISCELLANEOUS) ×2 IMPLANT
ELECT NEEDLE TIP 2.8 STRL (NEEDLE) IMPLANT
GAUZE SPONGE 4X4 12PLY STRL LF (GAUZE/BANDAGES/DRESSINGS) ×4 IMPLANT
GLOVE BIO SURGEON STRL SZ 6.5 (GLOVE) ×4 IMPLANT
GLOVE ECLIPSE 6.5 STRL STRAW (GLOVE) ×6 IMPLANT
MARKER SKIN DUAL TIP RULER LAB (MISCELLANEOUS) ×2 IMPLANT
NDL SAFETY ECLIPSE 18X1.5 (NEEDLE) IMPLANT
NEEDLE 27GAX1X1/2 (NEEDLE) IMPLANT
NEEDLE HYPO 18GX1.5 SHARP (NEEDLE)
NEEDLE HYPO 25X1 1.5 SAFETY (NEEDLE) ×4 IMPLANT
NEEDLE HYPO 30X.5 LL (NEEDLE) ×2 IMPLANT
NS IRRIG 1000ML POUR BTL (IV SOLUTION) ×2 IMPLANT
PAD ALCOHOL SWAB (MISCELLANEOUS) ×2 IMPLANT
PENCIL BUTTON HOLSTER BLD 10FT (ELECTRODE) ×2 IMPLANT
PUNCH BIOPSY DERMAL 2MM (MISCELLANEOUS) IMPLANT
PUNCH BIOPSY DERMAL 3MM (MISCELLANEOUS) IMPLANT
PUNCH BIOPSY DERMAL 4MM (MISCELLANEOUS) IMPLANT
PUNCH BIOPSY DERMAL 5MM STRL (MISCELLANEOUS) IMPLANT
SPONGE GAUZE 4X4 12PLY (GAUZE/BANDAGES/DRESSINGS) IMPLANT
STRIP CLOSURE SKIN 1/2X4 (GAUZE/BANDAGES/DRESSINGS) ×4 IMPLANT
STRIP CLOSURE SKIN 1/4X4 (GAUZE/BANDAGES/DRESSINGS) IMPLANT
STRIP SUTURE WOUND CLOSURE 1/2 (SUTURE) IMPLANT
SUT MNCRL AB 3-0 PS2 18 (SUTURE) ×8 IMPLANT
SUT MNCRL AB 4-0 PS2 18 (SUTURE) IMPLANT
SUT MON AB 2-0 CT1 36 (SUTURE) ×2 IMPLANT
SUT MON AB 5-0 P3 18 (SUTURE) IMPLANT
SUT PROLENE 5 0 PS 2 (SUTURE) IMPLANT
SUT PROLENE 6 0 P 1 18 (SUTURE) IMPLANT
SUT SILK 6 0 P 1 (SUTURE) ×2 IMPLANT
SUT VIC AB 5-0 P-3 18X BRD (SUTURE) IMPLANT
SUT VIC AB 5-0 P3 18 (SUTURE)
SWABSTICK POVIDONE IODINE SNGL (MISCELLANEOUS) IMPLANT
SYR 3ML 23GX1 SAFETY (SYRINGE) IMPLANT
SYR CONTROL 10ML LL (SYRINGE) ×2 IMPLANT
TOWEL OR 17X24 6PK STRL BLUE (TOWEL DISPOSABLE) ×4 IMPLANT

## 2011-10-13 NOTE — Brief Op Note (Signed)
10/13/2011  3:54 PM  PATIENT:  Rosine Abe  56 y.o. female  PRE-OPERATIVE DIAGNOSIS: 1) Basal Cell Cancer Left lower leg Anterior Shin and 2) Recurrent skin Lesion Left Upper Outer Buttock  POST-OPERATIVE DIAGNOSIS:  Same  PROCEDURE:  Procedure(s) (LRB): WLE BCC Left Lower Leg Anterior Shin w/ Intra-op Frozen Section Diagnosis and WLE Recurrent Skin Lesion Left Upper Outer Buttock  SURGEON:  Surgeon(s) and Role:    * Karie Fetch, MD - Primary   ANESTHESIA:   local  EBL:   minimal  LOCAL MEDICATIONS USED:  LIDOCAINE   SPECIMEN:  Source of Specimen:  Left Lower leg antrior shin and Left Upper Buttock  DISPOSITION OF SPECIMEN:  PATHOLOGY  COUNTS:  YES  DICTATION: .Other Dictation: Dictation Number [id number]  PLAN OF CARE: Discharge to home after PACU  PATIENT DISPOSITION:  PACU - hemodynamically stable.   Delay start of Pharmacological VTE agent (>24hrs) due to surgical blood loss or risk of bleeding: not applicable

## 2011-10-13 NOTE — OR Nursing (Signed)
Patient c/o feeling "jittery" post-procedure -- stated she had not eaten much today.  Given peanut butter graham crackers and coke.  After sitting for 20-30 minutes, patient states she feels fine; voices understanding of post-operative instructions.  Discharged ambulatory to lobby.

## 2011-10-13 NOTE — H&P (Signed)
  Pre-op H&P has been faxed to the surgical center. The patient has been re-examined, the H&P has been reviewed and there is no change in the plan of care.

## 2011-10-18 ENCOUNTER — Encounter (HOSPITAL_BASED_OUTPATIENT_CLINIC_OR_DEPARTMENT_OTHER): Payer: Self-pay | Admitting: Plastic Surgery

## 2011-10-26 NOTE — Op Note (Signed)
[name], [name]       ACCOUNT NO.:  [id number]  MEDICAL RECORD NO.:  [id number]  LOCATION:                                 FACILITY:  PHYSICIAN:  [name], M.D.DATE OF BIRTH:  08-23-1955  DATE OF PROCEDURE:  10/13/2011 DATE OF DISCHARGE:  10/13/2011                              OPERATIVE REPORT   PREOPERATIVE DIAGNOSIS:  1)Left lower leg anterior shin basal cell cancer, 2)Recurrent skin lesion versus scar in the left upper outer buttock area.  POSTOPERATIVE DIAGNOSIS: 1) Left lower leg anterior shin basal cell cancer, 2) Recurrent skin lesion versus scar in the left upper outer buttock area.  PROCEDURE: 1. Complex closure 5.0 cm left lower leg anterior shin incisions. 2. Wide local excision, 2.1 cm basal cell cancer, left lower     leg anterior shin with intraoperative frozen section diagnosis. 3. Complex closure, 7.2 cm left upper outer buttock incision. 4. Wide local excision 2.9 cm skin lesion left upper outer buttock.  ATTENDING SURGEON:  [name], M.D.  ANESTHESIA:  Local.  INDICATIONS FOR THE PROCEDURE:  The patient is a 56 year old Caucasian female, who has a biopsy-proven basal cell cancer of the left lower leg anterior shin.  She presents to undergo excision of the basal cell with intraoperative frozen section diagnosis.  Additionally, she also has a suspicious lesion on the left upper outer buttock that she would like excised.  PROCEDURE IN DETAIL: Both areas to be excised were marked in the preop holding area.  She was then brought back to the OR and laid supine on the table. Both the left lower extremity skin lesion area and the left upper buttock areas were anesthetized with 1% lidocaine with epinephrine. After adequate hemostasis and anesthesia taken effect the procedure was begun.  Both of the areas were prepped with Betadine and draped in sterile fashion.  Complete surgical excision was performed using the loop  magnification.  First the basal cell cancer area and then the left upper outer buttock skin lesion with 1-2 mm margins.  Intraoperative frozen section diagnosis was performed of the left lower leg skin lesion and the initial path report was that there was still basal cell cancer above the 12 o'clock margin.  This margin was then reexcised, margins were marked properly for the pathologist for a second intraoperative frozen section diagnosis.  These margins were then cleared as no cancer was present at the margins.  Attention was then turned to the Left upper outer buttock skin lesion. We did not perform intraoperative frozen section diagnosis on the left buttock skin lesion.  Both of the incisions were closed in a complex fashion. There had to be extensive undermining of the left anterior shin lower leg skin lesion as it was very tight over the shin.  Meticulous hemostasis was obtained at both areas.  The deep subcutaneous tissues and dermal layer were closed with 2-0 Monocryl suture in each incision. Superficial dermal layers were closed with 3-0 Monocryl suture in both incisions.  The skin was then closed with 3-0 Monocryl in a running intracuticular stitch in both incisions.  Steri-Strips were applied for a dressing followed 4 x 4 gauze and Hypafix tape.  The total lengths of the skin lesion  excisions were 2.1 cm for the left lower leg anterior shin basal cell cancer and 2.9 cm for the left upper outer buttock skin lesion.  Total lengths of complex closure was 5.0 cm for the left lower leg anterior shin skin incision and 7.2 cm for the left upper outer buttock incision.  The patient tolerated the procedures well.  The patient recovered without complications.  There were no intraoperative complications.  She was then given proper postoperative wound care instructions and discharged home in stable condition. Followup appointment will be tomorrow in the office.           ______________________________ [name], M.D.     MC/MEDQ  D:  10/22/2011  T:  10/22/2011  Job:  [id number]

## 2012-01-31 ENCOUNTER — Ambulatory Visit (HOSPITAL_BASED_OUTPATIENT_CLINIC_OR_DEPARTMENT_OTHER): Payer: BC Managed Care – PPO | Attending: Otolaryngology

## 2012-02-15 ENCOUNTER — Other Ambulatory Visit: Payer: Self-pay | Admitting: Internal Medicine

## 2012-04-17 ENCOUNTER — Encounter: Payer: Self-pay | Admitting: Internal Medicine

## 2012-04-19 ENCOUNTER — Encounter: Payer: Self-pay | Admitting: Cardiovascular Disease

## 2012-06-22 ENCOUNTER — Other Ambulatory Visit: Payer: Self-pay | Admitting: Internal Medicine

## 2012-06-22 NOTE — Telephone Encounter (Signed)
Needs OV  Last seen 2012

## 2012-06-25 NOTE — Telephone Encounter (Signed)
Tried to contact the patient to inform but no answer and vm full

## 2012-08-27 ENCOUNTER — Ambulatory Visit (INDEPENDENT_AMBULATORY_CARE_PROVIDER_SITE_OTHER): Payer: BC Managed Care – PPO | Admitting: General Surgery

## 2012-08-27 ENCOUNTER — Encounter (INDEPENDENT_AMBULATORY_CARE_PROVIDER_SITE_OTHER): Payer: Self-pay | Admitting: General Surgery

## 2012-08-27 VITALS — BP 122/78 | HR 76 | Resp 14 | Ht 69.0 in | Wt 165.4 lb

## 2012-08-27 DIAGNOSIS — K602 Anal fissure, unspecified: Secondary | ICD-10-CM

## 2012-08-27 NOTE — Progress Notes (Signed)
Chief Complaint  Patient presents with  . New Evaluation    eval hems    HISTORY: [name] is a 57 y.o. female who presents to the office with rectal bleeding.  Other symptoms include some mild pain.  This had been occurring for ~68mo.  She has tried a fiber pill in the past.  She mainly has trouble when she is travelling.  Hard BMs makes the symptoms worse.   It is intermittent in nature.  Her bowel habits are regular and her bowel movements are soft while she's in the country but has constipation when she travels to [place] monthly.  Her fiber intake is good.  Her last colonoscopy was ~4 years ago, and she is scheduled for a repeat exam next month with Dr Loreta Ave.      Past Medical History  Diagnosis Date  . Asthma 09/02/2010  . Allergic rhinitis, cause unspecified 09/02/2010  . Hemorrhoids 09/02/2010  . Anal abscess 09/02/2010  . Thyroid nodule 09/02/2010  . Uterine cancer 09/02/2010  . Vitamin D deficiency 09/02/2010  . Insomnia 09/02/2010  . IBS (irritable bowel syndrome)       Past Surgical History  Procedure Laterality Date  . Carpal tunnel release  2009  . Abdominal hysterectomy    . Hemorrhoid surgery    . Colonoscopy    . Lesion removal  10/13/2011    Procedure: MINOR EXICISION OF LESION;  Surgeon: Karie Fetch, MD;  Location:  SUR[facility]ERY [facility];  Service: Plastics;  Laterality: Left;  Excision basal cell carsinoma  left shin and Removal reccuring lesion from left buttock        Current Outpatient Prescriptions  Medication Sig Dispense Refill  . cetirizine (ZYRTEC) 10 MG tablet Take 10 mg by mouth daily.      . Fluticasone-Salmeterol (ADVAIR DISKUS) 250-50 MCG/DOSE AEPB Inhale 1 puff into the lungs 2 (two) times daily.  60 each  11  . sertraline (ZOLOFT) 50 MG tablet Take 1 tablet (50 mg total) by mouth daily.  30 tablet  11  . azelastine (ASTELIN) 137 MCG/SPRAY nasal spray Place 1 spray into the nose 2 (two) times daily. Use in each nostril as directed  30  mL  11  . desloratadine (CLARINEX) 5 MG tablet Take 1 tablet (5 mg total) by mouth 2 (two) times daily.  30 tablet  11   No current facility-administered medications for this visit.      No Known Allergies    Family History  Problem Relation Age of Onset  . Lung cancer Mother   . Melanoma Mother   . Diabetes Maternal Grandmother     History   Social History  . Marital Status: Divorced    Spouse Name: N/A    Number of Children: 1  . Years of Education: N/A   Occupational History  . design    Social History Main Topics  . Smoking status: Former Games developer  . Smokeless tobacco: Never Used  . Alcohol Use: Yes  . Drug Use: No  . Sexually Active: None   Other Topics Concern  . None   Social History Narrative   2 caffeinated drinks daily      REVIEW OF SYSTEMS - PERTINENT POSITIVES ONLY: Review of Systems - General ROS: negative for - chills, fever or weight loss Hematological and Lymphatic ROS: negative for - bleeding problems, blood clots or bruising Respiratory ROS: no cough, shortness of breath, or wheezing Cardiovascular ROS: no chest pain or dyspnea on exertion  Gastrointestinal ROS: no abdominal pain, change in bowel habits, or black or bloody stools Genito-Urinary ROS: no dysuria, trouble voiding, or hematuria  EXAM: Filed Vitals:   08/27/12 1035  BP: 122/78  Pulse: 76  Resp: 14    General appearance: alert and cooperative Resp: clear to auscultation bilaterally Cardio: regular rate and rhythm GI: soft, non-tender; bowel sounds normal; no masses,  no organomegaly   Procedure: Anoscopy Surgeon: Maisie Fus Diagnosis: anal pain  Assistant: Christella Scheuermann After the risks and benefits were explained, verbal consent was obtained for above procedure  Anesthesia: none Findings: small, healing posterior anal fissure, grade 2 internal hemorrhoids, L sided scar    ASSESSMENT AND PLAN: [name] is a 57 y.o. F with intermittent constipation.  On exam, she has  a small healing anal fissure.  I believe that this is the source of her pain.  We discussed treatment, which mainly consists of keeping her constipation under control in [place].  I have instructed her to take a stool softener and drink more water while she is over there.  She will return if her symptoms do not get better.      Vanita Panda, MD Colon and Rectal Surgery / General Surgery [facility]      Visit Diagnoses: 1. Anal fissure     Primary Care Physician: Oliver Barre, MD

## 2012-08-27 NOTE — Patient Instructions (Addendum)

## 2014-03-19 ENCOUNTER — Ambulatory Visit: Payer: Self-pay

## 2014-11-11 ENCOUNTER — Telehealth: Payer: Self-pay | Admitting: *Deleted

## 2014-11-11 NOTE — Telephone Encounter (Signed)
"  I was just on the phone with [facility].  We were talking about potential appointments, October 7 to see Dr. Jacqualyn Posey.  My question is, if I see him on October 7th, I already know I need surgery.  I been to other doctors so I already know.  My question is when would a potential surgery date be.  I'm trying to work around prior obligations.  Please give me a call."

## 2014-11-11 NOTE — Telephone Encounter (Signed)
Pt states she was referred by Dr. Gershon Mussel and at the time was not able to do surgery, but Myriam Jacobson from Dr. Lindley Magnus office states we have all of the referral information so we should be able to set up her appts.  Transferred pt to [employer].

## 2014-11-12 NOTE — Telephone Encounter (Signed)
I'm returning your call.  "Yes, I'm scheduled to see Dr. Jacqualyn Posey on 12/05/2014.  I'd like to go ahead and see if I can schedule my surgery.  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  What dates doe she have available?"  He can do it October 12, 19 or 26.  "I know I haven't done the necessary paperwork but can you go ahead and schedule me for the 26th?"  I can pencil you in for 12/24/2014 but I can't guarantee I will be able to hold it.  "Okay great, that is reasonable."

## 2015-02-05 ENCOUNTER — Other Ambulatory Visit: Payer: Self-pay | Admitting: Orthopedic Surgery

## 2015-05-22 ENCOUNTER — Encounter (HOSPITAL_BASED_OUTPATIENT_CLINIC_OR_DEPARTMENT_OTHER): Payer: Self-pay | Admitting: *Deleted

## 2015-05-22 NOTE — Progress Notes (Addendum)
Bring all medications

## 2015-05-27 ENCOUNTER — Encounter (HOSPITAL_BASED_OUTPATIENT_CLINIC_OR_DEPARTMENT_OTHER): Payer: Self-pay | Admitting: Anesthesiology

## 2015-05-27 NOTE — Anesthesia Preprocedure Evaluation (Addendum)
Anesthesia Evaluation  Patient identified by MRN, date of birth, ID band Patient awake    Reviewed: Allergy & Precautions, H&P , NPO status , Patient's Chart, lab work & pertinent test results  Airway Mallampati: I  TM Distance: >3 FB Neck ROM: full    Dental no notable dental hx.    Pulmonary former smoker,    Pulmonary exam normal        Cardiovascular negative cardio ROS Normal cardiovascular exam     Neuro/Psych negative neurological ROS     GI/Hepatic negative GI ROS, Neg liver ROS,   Endo/Other  negative endocrine ROS  Renal/GU negative Renal ROS     Musculoskeletal   Abdominal Normal abdominal exam  (+)   Peds  Hematology negative hematology ROS (+)   Anesthesia Other Findings   Reproductive/Obstetrics negative OB ROS                            Anesthesia Physical Anesthesia Plan  ASA: II  Anesthesia Plan: Spinal   Post-op Pain Management:    Induction:   Airway Management Planned:   Additional Equipment:   Intra-op Plan:   Post-operative Plan:   Informed Consent: I have reviewed the patients History and Physical, chart, labs and discussed the procedure including the risks, benefits and alternatives for the proposed anesthesia with the patient or authorized representative who has indicated his/her understanding and acceptance.     Plan Discussed with: CRNA, Anesthesiologist and Surgeon  Anesthesia Plan Comments:        Anesthesia Quick Evaluation

## 2015-05-28 ENCOUNTER — Encounter (HOSPITAL_BASED_OUTPATIENT_CLINIC_OR_DEPARTMENT_OTHER): Payer: Self-pay

## 2015-05-28 ENCOUNTER — Ambulatory Visit (HOSPITAL_BASED_OUTPATIENT_CLINIC_OR_DEPARTMENT_OTHER)
Admission: RE | Admit: 2015-05-28 | Discharge: 2015-05-28 | Disposition: A | Discharge status: Home / Self Care | Payer: BLUE CROSS/BLUE SHIELD | Source: Ambulatory Visit | Attending: Orthopedic Surgery | Admitting: Orthopedic Surgery

## 2015-05-28 ENCOUNTER — Ambulatory Visit (HOSPITAL_BASED_OUTPATIENT_CLINIC_OR_DEPARTMENT_OTHER): Payer: BLUE CROSS/BLUE SHIELD | Admitting: Anesthesiology

## 2015-05-28 ENCOUNTER — Encounter (HOSPITAL_BASED_OUTPATIENT_CLINIC_OR_DEPARTMENT_OTHER)
Admission: RE | Disposition: A | Discharge status: Home / Self Care | Payer: Self-pay | Source: Ambulatory Visit | Attending: Orthopedic Surgery

## 2015-05-28 DIAGNOSIS — G4700 Insomnia, unspecified: Secondary | ICD-10-CM | POA: Insufficient documentation

## 2015-05-28 DIAGNOSIS — M2022 Hallux rigidus, left foot: Secondary | ICD-10-CM | POA: Diagnosis present

## 2015-05-28 DIAGNOSIS — Z87891 Personal history of nicotine dependence: Secondary | ICD-10-CM | POA: Insufficient documentation

## 2015-05-28 DIAGNOSIS — J45909 Unspecified asthma, uncomplicated: Secondary | ICD-10-CM | POA: Insufficient documentation

## 2015-05-28 DIAGNOSIS — Z91018 Allergy to other foods: Secondary | ICD-10-CM | POA: Insufficient documentation

## 2015-05-28 DIAGNOSIS — Z79899 Other long term (current) drug therapy: Secondary | ICD-10-CM | POA: Diagnosis not present

## 2015-05-28 DIAGNOSIS — Z90710 Acquired absence of both cervix and uterus: Secondary | ICD-10-CM | POA: Insufficient documentation

## 2015-05-28 DIAGNOSIS — K589 Irritable bowel syndrome without diarrhea: Secondary | ICD-10-CM | POA: Insufficient documentation

## 2015-05-28 DIAGNOSIS — Z8542 Personal history of malignant neoplasm of other parts of uterus: Secondary | ICD-10-CM | POA: Diagnosis not present

## 2015-05-28 HISTORY — PX: ARTHRODESIS METATARSALPHALANGEAL JOINT (MTPJ): SHX6566

## 2015-05-28 SURGERY — FUSION, JOINT, GREAT TOE
Anesthesia: Spinal | Site: Foot | Laterality: Left

## 2015-05-28 MED ORDER — LIDOCAINE HCL (CARDIAC) 20 MG/ML IV SOLN
INTRAVENOUS | Status: AC
  Filled 2015-05-28: qty 5

## 2015-05-28 MED ORDER — ONDANSETRON HCL 4 MG/2ML IJ SOLN
INTRAMUSCULAR | Status: DC | PRN
  Administered 2015-05-28: 4 mg via INTRAVENOUS

## 2015-05-28 MED ORDER — KETOROLAC TROMETHAMINE 30 MG/ML IJ SOLN
30.0000 mg | Freq: Once | INTRAMUSCULAR | Status: AC
  Administered 2015-05-28: 30 mg via INTRAVENOUS

## 2015-05-28 MED ORDER — ONDANSETRON HCL 4 MG PO TABS: 4.0000 mg | ORAL_TABLET | Freq: Every day | ORAL | Status: DC | PRN

## 2015-05-28 MED ORDER — TRAMADOL HCL 50 MG PO TABS
50.0000 mg | ORAL_TABLET | Freq: Four times a day (QID) | ORAL | Status: DC | PRN
Start: 2015-05-28 — End: 2017-04-16

## 2015-05-28 MED ORDER — 0.9 % SODIUM CHLORIDE (POUR BTL) OPTIME
TOPICAL | Status: DC | PRN
  Administered 2015-05-28: 300 mL

## 2015-05-28 MED ORDER — TRAMADOL-ACETAMINOPHEN 37.5-325 MG PO TABS
1.0000 | ORAL_TABLET | Freq: Once | ORAL | Status: AC
  Administered 2015-05-28: 1 via ORAL

## 2015-05-28 MED ORDER — SUCCINYLCHOLINE CHLORIDE 20 MG/ML IJ SOLN
INTRAMUSCULAR | Status: AC
  Filled 2015-05-28: qty 1

## 2015-05-28 MED ORDER — MIDAZOLAM HCL 2 MG/2ML IJ SOLN: 1.0000 mg | INTRAMUSCULAR | Status: DC | PRN

## 2015-05-28 MED ORDER — SENNA 8.6 MG PO TABS: 2.0000 | ORAL_TABLET | Freq: Two times a day (BID) | ORAL | Status: DC

## 2015-05-28 MED ORDER — OXYCODONE HCL 5 MG PO TABS: 5.0000 mg | ORAL_TABLET | ORAL | Status: DC | PRN

## 2015-05-28 MED ORDER — KETOROLAC TROMETHAMINE 30 MG/ML IJ SOLN
INTRAMUSCULAR | Status: AC
  Filled 2015-05-28: qty 1

## 2015-05-28 MED ORDER — FENTANYL CITRATE (PF) 100 MCG/2ML IJ SOLN
INTRAMUSCULAR | Status: AC
  Filled 2015-05-28: qty 2

## 2015-05-28 MED ORDER — DEXAMETHASONE SODIUM PHOSPHATE 10 MG/ML IJ SOLN
INTRAMUSCULAR | Status: AC
  Filled 2015-05-28: qty 1

## 2015-05-28 MED ORDER — ASPIRIN EC 81 MG PO TBEC: 81.0000 mg | DELAYED_RELEASE_TABLET | Freq: Two times a day (BID) | ORAL | Status: DC

## 2015-05-28 MED ORDER — DOCUSATE SODIUM 100 MG PO CAPS: 100.0000 mg | ORAL_CAPSULE | Freq: Two times a day (BID) | ORAL | Status: DC

## 2015-05-28 MED ORDER — LACTATED RINGERS IV SOLN
INTRAVENOUS | Status: DC
  Administered 2015-05-28: 07:00:00 via INTRAVENOUS

## 2015-05-28 MED ORDER — FENTANYL CITRATE (PF) 100 MCG/2ML IJ SOLN: 50.0000 ug | INTRAMUSCULAR | Status: DC | PRN

## 2015-05-28 MED ORDER — BUPIVACAINE IN DEXTROSE 0.75-8.25 % IT SOLN
INTRATHECAL | Status: DC | PRN
  Administered 2015-05-28: 1.6 mL via INTRATHECAL

## 2015-05-28 MED ORDER — SODIUM CHLORIDE 0.9 % IV SOLN: INTRAVENOUS | Status: DC

## 2015-05-28 MED ORDER — ONDANSETRON HCL 4 MG/2ML IJ SOLN
INTRAMUSCULAR | Status: AC
  Filled 2015-05-28: qty 2

## 2015-05-28 MED ORDER — MIDAZOLAM HCL 2 MG/2ML IJ SOLN
INTRAMUSCULAR | Status: AC
  Filled 2015-05-28: qty 2

## 2015-05-28 MED ORDER — ONDANSETRON HCL 4 MG/2ML IJ SOLN
4.0000 mg | Freq: Once | INTRAMUSCULAR | Status: DC | PRN
Start: 2015-05-28 — End: 2015-05-28

## 2015-05-28 MED ORDER — TRAMADOL HCL 50 MG PO TABS
ORAL_TABLET | ORAL | Status: AC
  Filled 2015-05-28: qty 1

## 2015-05-28 MED ORDER — GLYCOPYRROLATE 0.2 MG/ML IJ SOLN: 0.2000 mg | Freq: Once | INTRAMUSCULAR | Status: DC | PRN

## 2015-05-28 MED ORDER — SCOPOLAMINE 1 MG/3DAYS TD PT72: 1.0000 | MEDICATED_PATCH | Freq: Once | TRANSDERMAL | Status: DC | PRN

## 2015-05-28 MED ORDER — PROPOFOL 500 MG/50ML IV EMUL
INTRAVENOUS | Status: AC
  Filled 2015-05-28: qty 50

## 2015-05-28 MED ORDER — CHLORHEXIDINE GLUCONATE 4 % EX LIQD: 60.0000 mL | Freq: Once | CUTANEOUS | Status: DC

## 2015-05-28 MED ORDER — CEFAZOLIN SODIUM-DEXTROSE 2-4 GM/100ML-% IV SOLN
INTRAVENOUS | Status: AC
  Filled 2015-05-28: qty 100

## 2015-05-28 MED ORDER — DEXTROSE 5 % IV SOLN
2.0000 g | INTRAVENOUS | Status: AC
  Administered 2015-05-28: 2 g via INTRAVENOUS

## 2015-05-28 SURGICAL SUPPLY — 80 items
BANDAGE ESMARK 6X9 LF (GAUZE/BANDAGES/DRESSINGS) ×1 IMPLANT
BIT DRILL 2.0 (BIT) ×1
BIT DRILL 2.4 AO COUPLING CANN (BIT) ×2 IMPLANT
BIT DRILL 2XNS DISP SS SM FRAG (BIT) ×1 IMPLANT
BIT DRL 2XNS DISP SS SM FRAG (BIT) ×1
BLADE AVERAGE 25X9 (BLADE) IMPLANT
BLADE MICRO SAGITTAL (BLADE) IMPLANT
BLADE OSC/SAG .038X5.5 CUT EDG (BLADE) IMPLANT
BLADE SURG 15 STRL LF DISP TIS (BLADE) ×2 IMPLANT
BLADE SURG 15 STRL SS (BLADE) ×2
BNDG COHESIVE 4X5 TAN STRL (GAUZE/BANDAGES/DRESSINGS) ×2 IMPLANT
BNDG COHESIVE 6X5 TAN STRL LF (GAUZE/BANDAGES/DRESSINGS) ×2 IMPLANT
BNDG ESMARK 6X9 LF (GAUZE/BANDAGES/DRESSINGS) ×2
CHLORAPREP W/TINT 26ML (MISCELLANEOUS) ×2 IMPLANT
COVER BACK TABLE 60X90IN (DRAPES) ×2 IMPLANT
CUFF TOURNIQUET SINGLE 34IN LL (TOURNIQUET CUFF) ×2 IMPLANT
DRAPE EXTREMITY T 121X128X90 (DRAPE) ×2 IMPLANT
DRAPE OEC MINIVIEW 54X84 (DRAPES) ×2 IMPLANT
DRAPE U-SHAPE 47X51 STRL (DRAPES) ×2 IMPLANT
DRSG MEPITEL 4X7.2 (GAUZE/BANDAGES/DRESSINGS) ×2 IMPLANT
DRSG PAD ABDOMINAL 8X10 ST (GAUZE/BANDAGES/DRESSINGS) ×4 IMPLANT
ELECT REM PT RETURN 9FT ADLT (ELECTROSURGICAL) ×2
ELECTRODE REM PT RTRN 9FT ADLT (ELECTROSURGICAL) ×1 IMPLANT
GAUZE SPONGE 4X4 12PLY STRL (GAUZE/BANDAGES/DRESSINGS) ×2 IMPLANT
GLOVE BIO SURGEON STRL SZ8 (GLOVE) ×2 IMPLANT
GLOVE BIOGEL PI IND STRL 7.0 (GLOVE) ×2 IMPLANT
GLOVE BIOGEL PI IND STRL 8 (GLOVE) ×2 IMPLANT
GLOVE BIOGEL PI INDICATOR 7.0 (GLOVE) ×2
GLOVE BIOGEL PI INDICATOR 8 (GLOVE) ×2
GLOVE ECLIPSE 6.5 STRL STRAW (GLOVE) ×2 IMPLANT
GLOVE ECLIPSE 7.5 STRL STRAW (GLOVE) ×2 IMPLANT
GLOVE EXAM NITRILE MD LF STRL (GLOVE) IMPLANT
GOWN STRL REUS W/ TWL LRG LVL3 (GOWN DISPOSABLE) ×1 IMPLANT
GOWN STRL REUS W/ TWL XL LVL3 (GOWN DISPOSABLE) ×2 IMPLANT
GOWN STRL REUS W/TWL LRG LVL3 (GOWN DISPOSABLE) ×1
GOWN STRL REUS W/TWL XL LVL3 (GOWN DISPOSABLE) ×2
K-WIRE ACE 1.6X6 (WIRE) ×2
K-WIRE TROC 1.25X150 (WIRE) ×2
KWIRE ACE 1.6X6 (WIRE) ×1 IMPLANT
KWIRE TROC 1.25X150 (WIRE) ×1 IMPLANT
NEEDLE HYPO 22GX1.5 SAFETY (NEEDLE) IMPLANT
PACK BASIN DAY SURGERY FS (CUSTOM PROCEDURE TRAY) ×2 IMPLANT
PAD CAST 4YDX4 CTTN HI CHSV (CAST SUPPLIES) ×1 IMPLANT
PADDING CAST ABS 4INX4YD NS (CAST SUPPLIES)
PADDING CAST ABS COTTON 4X4 ST (CAST SUPPLIES) IMPLANT
PADDING CAST COTTON 4X4 STRL (CAST SUPPLIES) ×1
PADDING CAST COTTON 6X4 STRL (CAST SUPPLIES) ×2 IMPLANT
PENCAN SPINAL TRAY ×2 IMPLANT
PENCIL BUTTON HOLSTER BLD 10FT (ELECTRODE) ×2 IMPLANT
PLATE SM 1/4 TUBULAR 5H (Plate) ×2 IMPLANT
SANITIZER HAND PURELL 535ML FO (MISCELLANEOUS) ×2 IMPLANT
SCREW CANN 4.0X38MM (Screw) ×2 IMPLANT
SCREW CANN PT 4.0X34 (Screw) ×2 IMPLANT
SCREW CORTICAL 2.7MM  14MM (Screw) ×1 IMPLANT
SCREW CORTICAL 2.7MM  18MM (Screw) ×1 IMPLANT
SCREW CORTICAL 2.7MM  20MM (Screw) ×1 IMPLANT
SCREW CORTICAL 2.7MM 14MM (Screw) ×1 IMPLANT
SCREW CORTICAL 2.7MM 16MM (Screw) ×2 IMPLANT
SCREW CORTICAL 2.7MM 18MM (Screw) ×1 IMPLANT
SCREW CORTICAL 2.7MM 20MM (Screw) ×1 IMPLANT
SHEET MEDIUM DRAPE 40X70 STRL (DRAPES) ×2 IMPLANT
SLEEVE SCD COMPRESS KNEE MED (MISCELLANEOUS) ×2 IMPLANT
SPLINT FAST PLASTER 5X30 (CAST SUPPLIES) ×20
SPLINT PLASTER CAST FAST 5X30 (CAST SUPPLIES) ×20 IMPLANT
SPONGE LAP 18X18 X RAY DECT (DISPOSABLE) ×2 IMPLANT
SPONGE SURGIFOAM ABS GEL 12-7 (HEMOSTASIS) IMPLANT
STOCKINETTE 6  STRL (DRAPES) ×1
STOCKINETTE 6 STRL (DRAPES) ×1 IMPLANT
SUCTION FRAZIER HANDLE 10FR (MISCELLANEOUS) ×1
SUCTION TUBE FRAZIER 10FR DISP (MISCELLANEOUS) ×1 IMPLANT
SUT ETHILON 3 0 PS 1 (SUTURE) ×2 IMPLANT
SUT MNCRL AB 3-0 PS2 18 (SUTURE) ×2 IMPLANT
SUT VIC AB 0 SH 27 (SUTURE) IMPLANT
SUT VIC AB 2-0 SH 27 (SUTURE) ×1
SUT VIC AB 2-0 SH 27XBRD (SUTURE) ×1 IMPLANT
SYR BULB 3OZ (MISCELLANEOUS) ×2 IMPLANT
SYR CONTROL 10ML LL (SYRINGE) IMPLANT
TOWEL OR 17X24 6PK STRL BLUE (TOWEL DISPOSABLE) ×4 IMPLANT
TUBE CONNECTING 20X1/4 (TUBING) ×2 IMPLANT
UNDERPAD 30X30 (UNDERPADS AND DIAPERS) ×2 IMPLANT

## 2015-05-28 NOTE — Op Note (Signed)
[name], [name]              ACCOUNT NO.:  [id number]  MEDICAL RECORD NO.:  [id number]  LOCATION:                                 FACILITY:  PHYSICIAN:  Wylene Simmer, MD             DATE OF BIRTH:  DATE OF PROCEDURE:  05/28/2015 DATE OF DISCHARGE:                              OPERATIVE REPORT   PREOPERATIVE DIAGNOSIS:  Left hallux rigidus.  POSTOPERATIVE DIAGNOSIS:  Left hallux rigidus.  PROCEDURE: 1. Left hallux metacarpophalangeal joint arthrodesis. 2. Left foot AP and lateral radiographs.  SURGEON:  Wylene Simmer, M.D.  ASSISTANT:  Mechele Claude, PA-C.  ANESTHESIA:  Spinal.  ESTIMATED BLOOD LOSS:  Minimal.  TOURNIQUET TIME:  43 minutes at 250 mmHg.  COMPLICATIONS:  None apparent.  DISPOSITION:  Extubated, awake, and stable to recovery.  INDICATION FOR PROCEDURE:  The patient is a 61 year old woman who has a long history of left forefoot pain.  Radiographs reveal a grade 4 degenerative changes at the hallux MP joint.  She presents now for hallux MP joint arthrodesis.  She understands the risks and benefits, the alternative treatment options, and elects surgical treatment.  She specifically understands risks of bleeding, infection, nerve damage, blood clots, need for additional surgery, continued pain, nonunion, amputation and death.  PROCEDURE IN DETAIL:  After preoperative consent was obtained and the correct operative site was identified, the patient was brought to the operating room and placed supine on the operating table.  Spinal anesthesia had previously been administered.  Surgical time-out was taken.  Left lower extremity was prepped and draped in standard sterile fashion with a tourniquet around the thigh.  The extremity was exsanguinated and the tourniquet was inflated to 250 mmHg.  A longitudinal incision was then made over the hallux MP joint.  Sharp dissection was carried down through skin and subcutaneous tissue.  The extensor hallucis longus was  identified and protected throughout the case.  The dorsal joint capsule was elevated medially and laterally. All peripheral osteophytes were removed with a rongeur.  The joint was mobilized and a K-wire was inserted in the central portion of the head. A 20-mm concave reamer was then used to resect the remaining articular cartilage and subchondral bone.  The K-wire was then removed and placed in the base of the proximal phalanx and the 20-mm convex reamer was used to remove the remaining articular cartilage and subchondral bone.  The wound was then irrigated copiously.  The joint was reduced and provisionally pinned.  AP and lateral radiographs confirmed appropriate alignment of the pin.  The simulated weightbearing examination confirmed appropriate alignment of the toe.  A 4-mm Biomet cannulated screw was then inserted across the joint and was noted to compress appropriately. A 5-hole one-quarter tubular plate from the Biomet mini frag set was then applied to the dorsum of the toe and fixed distally with 2 screws and proximally with 2 screws.  These were 2.7-mm mini frag screws. Final AP and lateral radiographs confirmed appropriate position and length of all hardware and appropriate correction of the hallux rigidus condition.  The wound was then irrigated copiously.  Deep subcutaneous tissues were approximated over the plate  with simple sutures of 2-0 Vicryl, subcutaneous tissues were then approximated with inverted simple sutures of 3-0 Monocryl, and a running 3-0 nylon was used to close the skin incision.  Sterile dressings were applied followed by a well-padded short-leg splint.  Tourniquet was released after application of the dressings at 43 minutes.  The patient was awakened from anesthesia and transported to the recovery room in stable condition.  RADIOGRAPHS:  AP and lateral radiographs of the left foot are obtained intraoperatively.  These show interval arthrodesis of the hallux  MP joint with appropriately positioned hardware.  No other acute injuries are noted.  Mechele Claude, PA-C was present and scrubbed for the duration of the case.  His assistance was essential in positioning the patient, prepping and draping, gaining and maintaining exposure, performing the operation, and closing and dressing the wounds, as well as applying splint.     Wylene Simmer, MD     JH/MEDQ  D:  05/28/2015  T:  05/28/2015  Job:  [id number]

## 2015-05-28 NOTE — H&P (Signed)
[name] is an 60 y.o. female.   Chief Complaint:  Left foot pain HPI:  60 y/o female with left hallux rigidus.  She has failed non op treatment and presents today for left hallux MPJ arthrodesis.  Past Medical History  Diagnosis Date  . Asthma 09/02/2010  . Allergic rhinitis, cause unspecified 09/02/2010  . Hemorrhoids 09/02/2010  . Anal abscess 09/02/2010  . Thyroid nodule 09/02/2010  . Uterine cancer ([place]) 09/02/2010  . Vitamin D deficiency 09/02/2010  . Insomnia 09/02/2010  . IBS (irritable bowel syndrome)     Past Surgical History  Procedure Laterality Date  . Carpal tunnel release  2009  . Abdominal hysterectomy    . Hemorrhoid surgery    . Colonoscopy    . Lesion removal  10/13/2011    Procedure: MINOR EXICISION OF LESION;  Surgeon: Charlene Brooke, MD;  Location: [facility];  Service: Plastics;  Laterality: Left;  Excision basal cell carsinoma  left shin and Removal reccuring lesion from left buttock    Family History  Problem Relation Age of Onset  . Lung cancer Mother   . Melanoma Mother   . Diabetes Maternal Grandmother    Social History:  reports that she has quit smoking. She has never used smokeless tobacco. She reports that she drinks alcohol. She reports that she does not use illicit drugs.  Allergies:  Allergies  Allergen Reactions  . Other     Nuts    Medications Prior to Admission  Medication Sig Dispense Refill  . azelastine (ASTELIN) 137 MCG/SPRAY nasal spray Place 1 spray into the nose 2 (two) times daily. Use in each nostril as directed 30 mL 11  . Budesonide-Formoterol Fumarate (SYMBICORT IN) Inhale into the lungs.    . cetirizine (ZYRTEC) 10 MG tablet Take 10 mg by mouth daily.    Marland Kitchen desloratadine (CLARINEX) 5 MG tablet Take 1 tablet (5 mg total) by mouth 2 (two) times daily. 30 tablet 11  . sertraline (ZOLOFT) 50 MG tablet Take 1 tablet (50 mg total) by mouth daily. 30 tablet 11    No results found for this or any previous  visit (from the past 48 hour(s)). No results found.  ROS  No recent f/c/n/v/wt loss  Blood pressure 162/108, pulse 85, temperature 98.1 F (36.7 C), temperature source Oral, resp. rate 20, height 5\' 8"  (1.727 m), weight 77.111 kg (170 lb), SpO2 100 %. Physical Exam  wn wd woman in nad.  A and O x 4.  Mood and affect normal.  EOMI.  resp unlabored.  L foot with bunion and decreased ROM at MPJ.  Skin healthy and intact.  No lymphadenopathy.  5/5 strength in PF and DF of the ankle and toes.  Assessment/Plan L hallux rigidus and hallux valgus - to OR for left bunionectomy and hallux MPJ arthrodesis.  The risks and benefits of the alternative treatment options have been discussed in detail.  The patient wishes to proceed with surgery and specifically understands risks of bleeding, infection, nerve damage, blood clots, need for additional surgery, amputation and death.   Wylene Simmer, MD 29-May-2015, 7:19 AM

## 2015-05-28 NOTE — Discharge Instructions (Addendum)
Jessica Simmer, MD [place]  Please read the following information regarding your care after surgery.  Medications  You only need a prescription for the narcotic pain medicine (ex. oxycodone, Percocet, Norco).  All of the other medicines listed below are available over the counter. X acetominophen (Tylenol) 650 mg every 4-6 hours as you need for minor pain X oxycodone as prescribed for moderate to severe pain ?   Narcotic pain medicine (ex. oxycodone, Percocet, Vicodin) will cause constipation.  To prevent this problem, take the following medicines while you are taking any pain medicine. X docusate sodium (Colace) 100 mg twice a day X senna (Senokot) 2 tablets twice a day  X To help prevent blood clots, take a baby aspirin (81 mg) twice a day for two weeks after surgery.  You should also get up every hour while you are awake to move around.    Weight Bearing X Do not bear any weight on the operated leg or foot.  Cast / Splint / Dressing X Keep your splint or cast clean and dry.  Dont put anything (coat hanger, pencil, etc) down inside of it.  If it gets damp, use a hair dryer on the cool setting to dry it.  If it gets soaked, call the office to schedule an appointment for a cast change.     After your dressing, cast or splint is removed; you may shower, but do not soak or scrub the wound.  Allow the water to run over it, and then gently pat it dry.  Swelling It is normal for you to have swelling where you had surgery.  To reduce swelling and pain, keep your toes above your nose for at least 3 days after surgery.  It may be necessary to keep your foot or leg elevated for several weeks.  If it hurts, it should be elevated.  Follow Up Call my office at [phone] when you are discharged from the hospital or surgery center to schedule an appointment to be seen two weeks after surgery.  Call my office at [phone] if you develop a fever >101.5 F, nausea, vomiting,  bleeding from the surgical site or severe pain.    May take Aleve 220 mg, 2 tabs, twice daily for pain as needed. (or Motrin/Ibprofen 800 mg maximum every 6 hours as needed).  Regional Anesthesia Blocks  1. Numbness or the inability to move the "blocked" extremity may last from 3-48 hours after placement. The length of time depends on the medication injected and your individual response to the medication. If the numbness is not going away after 48 hours, call your surgeon.  2. The extremity that is blocked will need to be protected until the numbness is gone and the  Strength has returned. Because you cannot feel it, you will need to take extra care to avoid injury. Because it may be weak, you may have difficulty moving it or using it. You may not know what position it is in without looking at it while the block is in effect.  3. For blocks in the legs and feet, returning to weight bearing and walking needs to be done carefully. You will need to wait until the numbness is entirely gone and the strength has returned. You should be able to move your leg and foot normally before you try and bear weight or walk. You will need someone to be with you when you first try to ensure you do not fall and possibly risk injury.  4.  Bruising and tenderness at the needle site are common side effects and will resolve in a few days.  5. Persistent numbness or new problems with movement should be communicated to the surgeon or the [facility] [phone] [place] [phone]).   Post Anesthesia Home Care Instructions  Activity: Get plenty of rest for the remainder of the day. A responsible adult should stay with you for 24 hours following the procedure.  For the next 24 hours, DO NOT: -Drive a car -Paediatric nurse -Drink alcoholic beverages -Take any medication unless instructed by your physician -Make any legal decisions or sign important papers.  Meals: Start with  liquid foods such as gelatin or soup. Progress to regular foods as tolerated. Avoid greasy, spicy, heavy foods. If nausea and/or vomiting occur, drink only clear liquids until the nausea and/or vomiting subsides. Call your physician if vomiting continues.  Special Instructions/Symptoms: Your throat may feel dry or sore from the anesthesia or the breathing tube placed in your throat during surgery. If this causes discomfort, gargle with warm salt water. The discomfort should disappear within 24 hours.  If you had a scopolamine patch placed behind your ear for the management of post- operative nausea and/or vomiting:  1. The medication in the patch is effective for 72 hours, after which it should be removed.  Wrap patch in a tissue and discard in the trash. Wash hands thoroughly with soap and water. 2. You may remove the patch earlier than 72 hours if you experience unpleasant side effects which may include dry mouth, dizziness or visual disturbances. 3. Avoid touching the patch. Wash your hands with soap and water after contact with the patch.

## 2015-05-28 NOTE — Anesthesia Postprocedure Evaluation (Signed)
Anesthesia Post Note  Patient: [name]  Procedure(s) Performed: Procedure(s) (LRB): LEFT HALLUX METATARSOPHALANGEAL JOINT (MTPJ)   ARTHRODESIS (Left)  Patient location during evaluation: PACU Anesthesia Type: Spinal Level of consciousness: awake Pain management: pain level controlled Vital Signs Assessment: post-procedure vital signs reviewed and stable Respiratory status: spontaneous breathing Cardiovascular status: stable Postop Assessment: no headache, no backache, spinal receding, patient able to bend at knees, no signs of nausea or vomiting and adequate PO intake Anesthetic complications: no    Last Vitals:  Filed Vitals:   05/28/15 0942 05/28/15 1030  BP:  140/82  Pulse: 74 79  Temp:  36.6 C  Resp: 16 16    Last Pain:  Filed Vitals:   05/28/15 1059  PainSc: [place]

## 2015-05-28 NOTE — Brief Op Note (Signed)
05/28/2015  8:47 AM  PATIENT:  [name]  60 y.o. female  PRE-OPERATIVE DIAGNOSIS:  LEFT HALLUX RIGIDUS   POST-OPERATIVE DIAGNOSIS:  LEFT HALLUX RIGIDUS  Procedure(s): 1.  LEFT HALLUX METATARSOPHALANGEAL JOINT (MTPJ) ARTHRODESIS 2.  Left foot AP and lateral xrays  SURGEON:  Wylene Simmer, MD  ASSISTANT: Mechele Claude, PA-C  ANESTHESIA:   spinal  EBL:  minimal   TOURNIQUET:   Total Tourniquet Time Documented: Thigh (Left) - 43 minutes Total: Thigh (Left) - 43 minutes  COMPLICATIONS:  None apparent  DISPOSITION:  Extubated, awake and stable to recovery.  DICTATION ID:  [id number]

## 2015-05-28 NOTE — Anesthesia Procedure Notes (Addendum)
Spinal Patient location during procedure: OR Start time: 05/28/2015 7:45 AM End time: 05/28/2015 7:48 AM Staffing Anesthesiologist: Lyn Hollingshead Performed by: anesthesiologist  Preanesthetic Checklist Completed: patient identified, surgical consent, pre-op evaluation, timeout performed, IV checked, risks and benefits discussed and monitors and equipment checked Spinal Block Patient position: sitting Prep: Betadine Patient monitoring: heart rate, cardiac monitor, continuous pulse ox and blood pressure Approach: midline Location: L3-4 Injection technique: single-shot Needle Needle type: Sprotte  Needle gauge: 24 G Needle length: 9 cm Needle insertion depth: 5 cm Assessment Sensory level: T6

## 2015-05-28 NOTE — Transfer of Care (Signed)
Immediate Anesthesia Transfer of Care Note  Patient: [name]  Procedure(s) Performed: Procedure(s): LEFT HALLUX METATARSOPHALANGEAL JOINT (MTPJ)   ARTHRODESIS (Left)  Patient Location: PACU  Anesthesia Type:Spinal  Level of Consciousness: awake, alert , oriented and patient cooperative  Airway & Oxygen Therapy: Patient Spontanous Breathing  Post-op Assessment: Report given to RN and Post -op Vital signs reviewed and stable  Post vital signs: Reviewed and stable  Last Vitals:  Filed Vitals:   05/28/15 0700 05/28/15 0705  BP:  162/108  Pulse: 85   Temp: 36.7 C   Resp: 20     Complications: No apparent anesthesia complications

## 2015-05-29 ENCOUNTER — Encounter (HOSPITAL_BASED_OUTPATIENT_CLINIC_OR_DEPARTMENT_OTHER): Payer: Self-pay | Admitting: Orthopedic Surgery

## 2015-05-29 NOTE — Addendum Note (Signed)
Addendum  created 05/29/15 [id number] by Ernesta Amble Asyia Hornung, CRNA   Modules edited: Charges VN

## 2015-06-01 NOTE — Progress Notes (Signed)
Re: Positional Headache Approx. 4 days after Spinal Anesthetic  [name] contacted surgeon on today to discuss issues with positional headache. Dr. Doran Durand recommended she contact [facility] and speak with anesthesiologist on duty. After detailed conversation with [name] and reviewing her intraoperative anesthesia record, I believe her symptoms are consistent with a PDPH. Treatment options were discussed in detail. [name] has decided to pursue conservative treatment modalities at this time including but not limited to hydration, rest, caffeine and analgesics. If conservative measures do not provide relief by Wednesday, June 03, 2015, she will call directly so we can attempt to get her in for a blood patch in the short stay area of [facility]. All plans were discussed based on patient history alone. A detailed physical examination will be performed on Wednesday to determine of epidural blood patch is still indicated. [name] is agreeable to current plan.   Crissie Sickles Smith Robert, MD, [facility] Anesthesiology

## 2015-12-28 ENCOUNTER — Ambulatory Visit (INDEPENDENT_AMBULATORY_CARE_PROVIDER_SITE_OTHER): Payer: BLUE CROSS/BLUE SHIELD | Admitting: Podiatry

## 2015-12-28 ENCOUNTER — Ambulatory Visit (INDEPENDENT_AMBULATORY_CARE_PROVIDER_SITE_OTHER): Payer: BLUE CROSS/BLUE SHIELD

## 2015-12-28 ENCOUNTER — Encounter: Payer: Self-pay | Admitting: Podiatry

## 2015-12-28 DIAGNOSIS — M2022 Hallux rigidus, left foot: Secondary | ICD-10-CM | POA: Diagnosis not present

## 2015-12-28 DIAGNOSIS — R52 Pain, unspecified: Secondary | ICD-10-CM

## 2015-12-28 DIAGNOSIS — Z981 Arthrodesis status: Secondary | ICD-10-CM

## 2015-12-28 DIAGNOSIS — M79673 Pain in unspecified foot: Secondary | ICD-10-CM | POA: Diagnosis not present

## 2015-12-28 NOTE — Patient Instructions (Signed)

## 2015-12-28 NOTE — Progress Notes (Signed)
Subjective:    Patient ID: [name], female    DOB: Jun 13, 1955, 60 y.o.   MRN: [id number]  HPI  60 year old female presents the also concerns of left foot pain. She recently underwent first metatarsal phalangeal joint arthrodesis preformed by orthopedics in March of this year. She states that since the surgery she has had significant pain to her hip and her knee and she feels that she is off balance and she is not able to walk correctly. She feels the surgery went well but this was not the surgery for her. She presents today requesting that the hardware be removed and joint implant be placed. She states that she is tried changing her shoes without any relief and she continues to get quite a bit of pain to her hip and knee and a stabbing pain to her foot along the surgical site. No other complaints.   Review of Systems  Respiratory: Positive for wheezing.   Musculoskeletal: Positive for gait problem.       Muscle pain  All other systems reviewed and are negative.      Objective:   Physical Exam General: AAO x3, NAD  Dermatological: Incision from the prior surgeries well-healed. Nails x 10 are well manicured; remaining integument appears unremarkable at this time. There are no open sores, no preulcerative lesions, no rash or signs of infection present.  Vascular: Dorsalis Pedis artery and Posterior Tibial artery pedal pulses are 2/4 bilateral with immedate capillary fill time.There is no pain with calf compression, swelling, warmth, erythema.   Neruologic: Grossly intact via light touch bilateral. Vibratory intact via tuning fork bilateral. Protective threshold with Semmes Wienstein monofilament intact to all pedal sites bilateral.   Musculoskeletal: Recent arthrodesis of the left first metatarsal phalangeal joint which is stable. There is tenderness on this area. She does that she has tenderness to the knee as well. No other areas of tenderness of the foot. No pain, crepitus,  or limitation noted with foot and ankle range of motion bilateral. Muscular strength 5/5 in all groups tested bilateral.  Gait: Unassisted, Nonantalgic.      Assessment & Plan:  60 year old female with recent left first metatarsal phalangeal joint arthrodesis requesting hardware appears removed and the implant replaced -Treatment options discussed including all alternatives, risks, and complications -Etiology of symptoms were discussed -X-rays were obtained and reviewed with the patient.Hardware intact the previous surgery. Consolidation across the arthrodesis site. no evidence of acute fracture. -I had a long discussion the patient in regards to reversal the surgery to apply implant in the joint. I discussed with her the longevity of the implant that she would likely need to have another surgery to have a fusion done in the future. She states that she does not want her toe fused right now and she is having pain on a daily basis associated with it. She does not want to try orthotics. I discussed this is not a guarantee that her symptoms will improve and she will need to have any surgery in the future to either place a new implant or fuse the joint (which would be a bigger surgery than the original due to the need for bone graft). She understands all of this and she wants to proceed with the surgery as soon as possible.  -The incision placement as well as the postoperative course was discussed with the patient. I discussed risks of the surgery which include, but not limited to, infection, bleeding, pain, swelling, need for further surgery, delayed or nonhealing,  painful or ugly scar, numbness or sensation changes, over/under correction, recurrence, transfer lesions, further deformity, hardware failure, DVT/PE, loss of toe/foot. Patient understands these risks and wishes to proceed with surgery. The surgical consent was reviewed with the patient all 3 pages were signed. No promises or guarantees were given  to the outcome of the procedure. All questions were answered to the best of my ability. Before the surgery the patient was encouraged to call the office if there is any further questions. The surgery will be performed at the [facility] on an outpatient basis.  Celesta Gentile, DPM

## 2016-01-05 ENCOUNTER — Telehealth: Payer: Self-pay | Admitting: *Deleted

## 2016-01-05 NOTE — Telephone Encounter (Signed)
"  She said she has a timing issue and needs to cancel surgery.  So I am taking her off.  Thank you."

## 2016-01-06 NOTE — Telephone Encounter (Signed)
I called patient she said she was having issues with getting off time from work.  She asked me to reschedule her to December 20.  I called and left a message for Caren Griffins to reschedule surgery to December 20.

## 2016-01-11 ENCOUNTER — Encounter: Payer: BLUE CROSS/BLUE SHIELD | Admitting: Podiatry

## 2016-01-18 ENCOUNTER — Telehealth: Payer: Self-pay | Admitting: *Deleted

## 2016-01-18 NOTE — Telephone Encounter (Signed)
"  I am calling to reschedule my surgery to an earlier date.  My foot is killing me.  I don't know if I can wait that long.  I'd like to move it from 02/17/2016 to 02/10/2016."  Okay, that date is available, I will get it rescheduled.  "What time will I need to be there?"  They will call you with an arrival time a day or two prior to surgery date.   I called and left Caren Griffins a message to reschedule surgery from 02/17/2016 to 02/10/2016.

## 2016-01-25 ENCOUNTER — Encounter: Payer: BLUE CROSS/BLUE SHIELD | Admitting: Podiatry

## 2016-02-03 ENCOUNTER — Telehealth: Payer: Self-pay | Admitting: Podiatry

## 2016-02-03 NOTE — Telephone Encounter (Signed)
Pt had surgery over a yr ago by different doctors. Pt at [facility] clinic and would like to discuss changing procedure or possible having procedure at [facility] .please call asap

## 2016-02-03 NOTE — Telephone Encounter (Signed)
"  I am at the [facility] seeing several doctors.  I saw an Orthopedic and had him look at my foot.  I asked him his opinion and he suggested I have the plate and screws taken out but not getting an implant.  So I want to go that route.  Would Dr, Jacqualyn Posey agree to just take the plates and screws out and not do the implant?  If he agrees to it I would like to keep my surgery date.  I'd rather be at home and have it done than to do it here."  I will ask him and give you a call back tomorrow.

## 2016-02-04 ENCOUNTER — Telehealth: Payer: Self-pay | Admitting: *Deleted

## 2016-02-04 NOTE — Telephone Encounter (Signed)
That is fine. We can do a hardware removal- she will need to come back in and redo consent forms.

## 2016-02-04 NOTE — Telephone Encounter (Addendum)
Pt states she has appts with [facility] in [place], and needs to get information to Dr. Jacqualyn Posey concerning a change she wants to be done in her surgery. 02/04/2016-Left message informing pt that if she had changes she would like to discuss with Dr. Jacqualyn Posey concerning her surgical procedure, she would need an appt to discuss and possible sign consents at that time and if she needed information sent to another medical facility she would need to fax and release of records request to [phone] ATTN:  Coralee Pesa. Pt called again and request call back. Pt states she is scheduled in [place] to go over consent and possible changes for surgery on Wednesday and everything else is fine. 03/09/2016-[facility] states needs orders and demographics for pt. 03/10/2016-Faxed required form, Dr. Leigh Aurora orders and demographics.

## 2016-02-04 NOTE — Telephone Encounter (Signed)
"  I spoke to someone yesterday about my surgery."  You spoke with me.  Dr. Jacqualyn Posey said he can just do the hardware removal.  "That will be great!  How will I go about getting my paperwork completed?  Do you send that to me?"  No, you need to get paperwork from your human resources and fax it to [facility] at [phone].  Dr. Jacqualyn Posey said he will need to see you to change your consent form.  I can send you to a scheduler to make an appointment.  "That would be great, thank you."

## 2016-02-05 ENCOUNTER — Ambulatory Visit: Payer: BLUE CROSS/BLUE SHIELD | Admitting: Podiatry

## 2016-02-09 ENCOUNTER — Ambulatory Visit (INDEPENDENT_AMBULATORY_CARE_PROVIDER_SITE_OTHER): Payer: BLUE CROSS/BLUE SHIELD | Admitting: Podiatry

## 2016-02-09 ENCOUNTER — Encounter: Payer: Self-pay | Admitting: Podiatry

## 2016-02-09 DIAGNOSIS — Z969 Presence of functional implant, unspecified: Secondary | ICD-10-CM

## 2016-02-09 DIAGNOSIS — M2022 Hallux rigidus, left foot: Secondary | ICD-10-CM | POA: Insufficient documentation

## 2016-02-09 MED ORDER — IBUPROFEN 600 MG PO TABS: 600.0000 mg | ORAL_TABLET | Freq: Three times a day (TID) | ORAL | 1 refills | Status: DC | PRN

## 2016-02-09 MED ORDER — HYDROCODONE-ACETAMINOPHEN 5-325 MG PO TABS: 1.0000 | ORAL_TABLET | Freq: Four times a day (QID) | ORAL | 0 refills | Status: DC | PRN

## 2016-02-09 MED ORDER — PROMETHAZINE HCL 25 MG PO TABS
25.0000 mg | ORAL_TABLET | Freq: Three times a day (TID) | ORAL | 0 refills | Status: DC | PRN
Start: 2016-02-09 — End: 2017-04-16

## 2016-02-09 MED ORDER — CEPHALEXIN 500 MG PO CAPS: 500.0000 mg | ORAL_CAPSULE | Freq: Three times a day (TID) | ORAL | 0 refills | Status: DC

## 2016-02-09 NOTE — Progress Notes (Signed)
Subjective: 60 year old female presents the office today to discuss her surgery is scheduled for tomorrow. She appears in the first metatarsal phalangeal joint arthrodesis performed by orthopedics in March of this year. Unfortunately she states that she's had some difficulty since the surgery. She feels that she is off balance and she's having difficulty walking. After she had surgery she didn't follow back up with Dr. Gershon Mussel who referred her to me for removal of the arthrodesis and joint implant. She wants to go ahead and proceed with that surgery she was scheduled for this surgery. In the meantime she's had another opinion any other physician recommended hardware removal according of and the implant and removal of the arthrodesis.She presents today for surgical consultation to discuss the surgery. Denies any systemic complaints such as fevers, chills, nausea, vomiting. No acute changes since last appointment, and no other complaints at this time.   Objective: AAO x3, NAD DP/PT pulses palpable bilaterally, CRT less than 3 seconds Incision from the prior surgery is well-healed. Mild prominence the hardware is mild tenderness on the dorsal aspect of the joint. She has concerns that her toe is elevated and she is walking on the outside aspect of her foot since the surgery.She has no other areas of tenderness. No open lesions or pre-ulcerative lesions.  No pain with calf compression, swelling, warmth, erythema  Assessment: Status post first metatarsophalangeal joint fusion with pain  Plan: -All treatment options discussed with the patient including all alternatives, risks, complications.  -I again reviewed the exercise of the patient. I discussed with her all treatment options including no surgery and continued conservative treatment, removal of hardware, reversal the arthrodesis with joint implant. After long discussion the patient we will go ahead and proceed with a hardware removal. I discussed with her  this is not going to change the position of the toe.She'll likely benefit from orthotics that we can do postoperatively as well. I discussed with her that she may need to have another surgeon in the future and she understands this. She wishes to proceed with the first metatarsal phalangeal joint hardware removal. The incision placement as well as the postoperative course was discussed with the patient. I discussed risks of the surgery which include, but not limited to, infection, bleeding, pain, swelling, need for further surgery, delayed or nonhealing, painful or ugly scar, numbness or sensation changes, over/under correction, recurrence, transfer lesions, further deformity, hardware failure, DVT/PE, loss of toe/foot. Patient understands these risks and wishes to proceed with surgery. The surgical consent was reviewed with the patient all 3 pages were signed. No promises or guarantees were given to the outcome of the procedure. All questions were answered to the best of my ability. Before the surgery the patient was encouraged to call the office if there is any further questions. The surgery will be performed at the [facility] on an outpatient basis. -Post-operative medications dispensed today.  -Patient encouraged to call the office with any questions, concerns, change in symptoms.

## 2016-02-09 NOTE — Patient Instructions (Signed)

## 2016-02-10 ENCOUNTER — Encounter: Payer: Self-pay | Admitting: Podiatry

## 2016-02-10 DIAGNOSIS — Z4889 Encounter for other specified surgical aftercare: Secondary | ICD-10-CM

## 2016-02-10 NOTE — Progress Notes (Signed)
DOS 12.13.2017 Left Foot Hardware Removal

## 2016-02-12 ENCOUNTER — Telehealth: Payer: Self-pay | Admitting: Podiatry

## 2016-02-12 NOTE — Telephone Encounter (Signed)
Pt called and cxled her first POV on 12.18 she is going to be out of town(6 hrs away). Pt asked to be scheduled after christmas and was scheduled for 12.29.17 for her 2nd pov and I rescheduled it to 12.28.17 the first day Dr Viona Gilmore is back after the holidays. Pt asked what to do about bandage and per Dr Jacqualyn Posey she needs to keep it on until her appt. Pt verbalized understanding and said she would keep bandage on and just put a plastic bag over it when showering.

## 2016-02-15 ENCOUNTER — Encounter: Payer: BLUE CROSS/BLUE SHIELD | Admitting: Podiatry

## 2016-02-25 ENCOUNTER — Encounter: Payer: Self-pay | Admitting: Podiatry

## 2016-02-25 ENCOUNTER — Ambulatory Visit (INDEPENDENT_AMBULATORY_CARE_PROVIDER_SITE_OTHER): Payer: BLUE CROSS/BLUE SHIELD | Admitting: Podiatry

## 2016-02-25 ENCOUNTER — Ambulatory Visit (INDEPENDENT_AMBULATORY_CARE_PROVIDER_SITE_OTHER): Payer: BLUE CROSS/BLUE SHIELD

## 2016-02-25 VITALS — BP 146/90 | HR 69 | Resp 16

## 2016-02-25 DIAGNOSIS — Z9889 Other specified postprocedural states: Secondary | ICD-10-CM

## 2016-02-25 DIAGNOSIS — Z969 Presence of functional implant, unspecified: Secondary | ICD-10-CM

## 2016-02-25 NOTE — Progress Notes (Signed)
Subjective: [name] is a 60 y.o. is seen today in office s/p Left HWR preformed on 02/10/16 . She states she is having no pain. She also states so far she is very happy with the outcome of the surgery. Denies any systemic complaints such as fevers, chills, nausea, vomiting. No calf pain, chest pain, shortness of breath.   Objective: General: No acute distress, AAOx3  DP/PT pulses palpable 2/4, CRT < 3 sec to all digits.  Protective sensation intact. Motor function intact.  left foot: Incision is well coapted without any evidence of dehiscence and sutures are intact. There is no surrounding erythema, ascending cellulitis, fluctuance, crepitus, malodor, drainage/purulence. There is trace edema around the surgical site. There is no pain along the surgical site.  No other areas of tenderness to bilateral lower extremities.  No other open lesions or pre-ulcerative lesions.  No pain with calf compression, swelling, warmth, erythema.   Assessment and Plan:  Status post Left HWR, doing well with no complications   -Treatment options discussed including all alternatives, risks, and complications -X-rays were obtained and reviewed with the patient. S/p HWR, no evidence of acute fracture.  -She can start to transition to a regular shoe as tolerated but this should be a gradual transition.  -Ice/elevation -Pain medication as needed. -Monitor for any clinical signs or symptoms of infection and DVT/PE and directed to call the office immediately should any occur or go to the ER. -Follow-up as scheduled or sooner if any problems arise. In the meantime, encouraged to call the office with any questions, concerns, change in symptoms.   Celesta Gentile, DPM

## 2016-02-26 ENCOUNTER — Encounter: Payer: BLUE CROSS/BLUE SHIELD | Admitting: Podiatry

## 2016-03-08 ENCOUNTER — Ambulatory Visit (HOSPITAL_BASED_OUTPATIENT_CLINIC_OR_DEPARTMENT_OTHER)
Admission: RE | Admit: 2016-03-08 | Discharge: 2016-03-08 | Disposition: A | Discharge status: Home / Self Care | Payer: BLUE CROSS/BLUE SHIELD | Source: Ambulatory Visit | Attending: Podiatry | Admitting: Podiatry

## 2016-03-08 ENCOUNTER — Ambulatory Visit (INDEPENDENT_AMBULATORY_CARE_PROVIDER_SITE_OTHER): Payer: BLUE CROSS/BLUE SHIELD | Admitting: Podiatry

## 2016-03-08 ENCOUNTER — Encounter: Payer: Self-pay | Admitting: Podiatry

## 2016-03-08 DIAGNOSIS — Z981 Arthrodesis status: Secondary | ICD-10-CM | POA: Insufficient documentation

## 2016-03-08 DIAGNOSIS — M2022 Hallux rigidus, left foot: Secondary | ICD-10-CM | POA: Diagnosis not present

## 2016-03-08 DIAGNOSIS — Z969 Presence of functional implant, unspecified: Secondary | ICD-10-CM

## 2016-03-08 DIAGNOSIS — M2020 Hallux rigidus, unspecified foot: Secondary | ICD-10-CM | POA: Diagnosis present

## 2016-03-08 NOTE — Progress Notes (Signed)
Subjective: [name] is a 61 y.o. is seen today in office s/p Left HWR preformed on 02/10/16 .she presents today stating that the surgery is doing well her swelling is the best it has been within the last year. However she is having problems with this has been ongoing since her first surgery. She is inquiring about possible physical therapy. She has returned to regular shoe. She denies any recent injury or trauma to her feet. She again states she is very please so far with this surgery.  Denies any systemic complaints such as fevers, chills, nausea, vomiting. No calf pain, chest pain, shortness of breath.   Objective: General: No acute distress, AAOx3  DP/PT pulses palpable 2/4, CRT < 3 sec to all digits.  Protective sensation intact. Motor function intact.  Left foot: Incision is well coapted without any evidence of dehiscence and a scar has formed. There is no surrounding erythema, ascending cellulitis, fluctuance, crepitus, malodor, drainage/purulence. There is trace edema around the surgical site and overall appears to be much improved compared to what it was prior to surgery. There is no pain along the surgical site. Cavus foot type is present. No other areas of tenderness to bilateral lower extremities.  No other open lesions or pre-ulcerative lesions.  No pain with calf compression, swelling, warmth, erythema.   Assessment and Plan:  Status post Left HWR, doing well with no complications   -Treatment options discussed including all alternatives, risks, and complications -X-rays were obtained and reviewed with the patient. S/p HWR, does appear to have a small fracture to the medial distal phalanx. She has no clinical symptoms of pain to this area. -Continue supportive shoe gear. Also discussed with her custom orthotics. However, at this point we will starh physical therapy a prescription was provided today for this. -She can return to work 03/19/2016 after she gets on her feet  more and starts physical therapy. -Continue ice and elevation. -Follow-up as scheduled or sooner if any problems arise. In the meantime, encouraged to call the office with any questions, concerns, change in symptoms.   Celesta Gentile, DPM

## 2016-03-10 NOTE — Telephone Encounter (Signed)
[name] should have given her the form at check out. It is for swelling, gait training, help return to a regular shoe after surgery.

## 2017-03-17 ENCOUNTER — Ambulatory Visit (INDEPENDENT_AMBULATORY_CARE_PROVIDER_SITE_OTHER): Payer: BLUE CROSS/BLUE SHIELD

## 2017-03-17 ENCOUNTER — Ambulatory Visit: Payer: BLUE CROSS/BLUE SHIELD | Admitting: Podiatry

## 2017-03-17 DIAGNOSIS — R609 Edema, unspecified: Secondary | ICD-10-CM

## 2017-03-17 DIAGNOSIS — M2022 Hallux rigidus, left foot: Secondary | ICD-10-CM | POA: Diagnosis not present

## 2017-03-20 NOTE — Progress Notes (Signed)
Subjective: [name] presents the office today for concerns of continued pain on the left foot she still gets some swelling to the area.  She states this is been ongoing and has not really changed.  She starts to get cramps in the arch as well.  She denies any redness or warmth.  No recent injury or trauma.  After I last saw her she did not had any kind of physical therapy and she just went immediately back into a shoe.  She is inquiring about further options at this point.  She has no other concerns. Denies any systemic complaints such as fevers, chills, nausea, vomiting. No acute changes since last appointment, and no other complaints at this time.   Objective: AAO x3, NAD DP/PT pulses palpable bilaterally, CRT less than 3 seconds There does appear to be some mild edema to the dorsal first interspace on the left side which is chronic.  There is no erythema or increase in warmth.  The hallux is in rectus position the arthrodesis site appears to be stable.  There is no significant tenderness palpation.  Her main concern is that after having the arthrodesis she is unstable.  She feels that this throws her gait off.  No open lesions or pre-ulcerative lesions.  No pain with calf compression, swelling, warmth, erythema  Assessment: Continued pain, swelling status post first MPJ arthrodesis with subsequent hardware removal  Plan: -All treatment options discussed with the patient including all alternatives, risks, complications.  -X-rays were obtained and reviewed.  No evidence of acute fracture.  Arthrodesis site is stable. -At this point we discussed both conservative as well as surgical treatment options.  I do not recommend further surgical intervention at this time is a much as to help.  She was inquired about physical therapy will start this.  We will do this for [facility] and a prescription was written for this today. -Also she will likely benefit from orthotics as her gait  has been off.  Hopefully can help offload and balance her out with a custom insert.  She is to come back to see Liliane Channel for molding of the insert -Patient encouraged to call the office with any questions, concerns, change in symptoms.   Trula Slade DPM

## 2017-03-21 ENCOUNTER — Other Ambulatory Visit: Payer: BLUE CROSS/BLUE SHIELD | Admitting: Orthotics

## 2017-03-28 ENCOUNTER — Ambulatory Visit (INDEPENDENT_AMBULATORY_CARE_PROVIDER_SITE_OTHER): Payer: BLUE CROSS/BLUE SHIELD | Admitting: Orthotics

## 2017-03-28 DIAGNOSIS — M2020 Hallux rigidus, unspecified foot: Secondary | ICD-10-CM

## 2017-03-28 DIAGNOSIS — M2022 Hallux rigidus, left foot: Secondary | ICD-10-CM

## 2017-03-28 NOTE — Progress Notes (Signed)
[name] came into today to be cast for Custom Foot Orthotics. Upon recommendation of Dr. Jacqualyn Posey [name] presents with Hallux Rigidus Goals are arch support, reverse mortons to drop first ray Plan vendor [place]

## 2017-04-16 ENCOUNTER — Ambulatory Visit (HOSPITAL_COMMUNITY)
Admission: EM | Admit: 2017-04-16 | Discharge: 2017-04-16 | Disposition: A | Discharge status: Home / Self Care | Payer: BLUE CROSS/BLUE SHIELD | Attending: Internal Medicine | Admitting: Internal Medicine

## 2017-04-16 ENCOUNTER — Encounter (HOSPITAL_COMMUNITY): Payer: Self-pay | Admitting: *Deleted

## 2017-04-16 DIAGNOSIS — J069 Acute upper respiratory infection, unspecified: Secondary | ICD-10-CM | POA: Diagnosis not present

## 2017-04-16 DIAGNOSIS — J45901 Unspecified asthma with (acute) exacerbation: Secondary | ICD-10-CM | POA: Diagnosis not present

## 2017-04-16 MED ORDER — METHYLPREDNISOLONE SODIUM SUCC 125 MG IJ SOLR
INTRAMUSCULAR | Status: AC
Start: 2017-04-16 — End: ?
  Filled 2017-04-16: qty 2

## 2017-04-16 MED ORDER — METHYLPREDNISOLONE SODIUM SUCC 125 MG IJ SOLR
125.0000 mg | Freq: Once | INTRAMUSCULAR | Status: AC
  Administered 2017-04-16: 125 mg via INTRAMUSCULAR

## 2017-04-16 NOTE — ED Provider Notes (Signed)
[facility]    CSN: [id number] Arrival date & time: 04/16/17  1305     History   Chief Complaint Chief Complaint  Patient presents with  . Asthma  . Cough    HPI [name] is a 62 y.o. female.   Presents today with 3-day history of cough, productive of green phlegm, runny/congested nose, some sore throat.  Woke up in the middle of the night with central chest tightness, breathlessness.  She has a history of asthma, is on multiple medications to control this.  Typically, when she has an illness like this, what works best is a injection of steroids early in the course.    HPI  Past Medical History:  Diagnosis Date  . Allergic rhinitis, cause unspecified 09/02/2010  . Anal abscess 09/02/2010  . Asthma 09/02/2010  . Hemorrhoids 09/02/2010  . IBS (irritable bowel syndrome)   . Insomnia 09/02/2010  . Thyroid nodule 09/02/2010  . Uterine cancer ([facility]) 09/02/2010  . Vitamin D deficiency 09/02/2010    Patient Active Problem List   Diagnosis Date Noted  . Hallux rigidus of left foot 02/09/2016  . Retained orthopedic hardware 02/09/2016  . Thyroid nodule 09/02/2010  . Asthma 09/02/2010  . Allergic rhinitis, cause unspecified 09/02/2010  . Preventative health care 09/02/2010  . Anxiety and depression 09/02/2010  . Anal abscess 09/02/2010  . Hemorrhoids 09/02/2010  . Skin lesion 09/02/2010  . Uterine cancer ([facility]) 09/02/2010  . Vitamin D deficiency 09/02/2010  . Insomnia 09/02/2010    Past Surgical History:  Procedure Laterality Date  . ABDOMINAL HYSTERECTOMY    . ARTHRODESIS METATARSALPHALANGEAL JOINT (MTPJ) Left 05/28/2015   Procedure: LEFT HALLUX METATARSOPHALANGEAL JOINT (MTPJ)   ARTHRODESIS;  Surgeon: Wylene Simmer, MD;  Location: [facility];  Service: Orthopedics;  Laterality: Left;  . CARPAL TUNNEL RELEASE  2009  . COLONOSCOPY    . HEMORRHOID SURGERY    . LESION REMOVAL  10/13/2011   Procedure: MINOR EXICISION OF LESION;  Surgeon: Charlene Brooke, MD;  Location: [facility];  Service: Plastics;  Laterality: Left;  Excision basal cell carsinoma  left shin and Removal reccuring lesion from left buttock     Home Medications    Prior to Admission medications   Medication Sig Start Date End Date Taking? Authorizing Provider  DILTIAZEM HCL PO Take by mouth.   Yes [provider]  sertraline (ZOLOFT) 50 MG tablet Take 1 tablet (50 mg total) by mouth daily. 09/02/10  Yes Biagio Borg, MD  azelastine (ASTELIN) 137 MCG/SPRAY nasal spray Place 1 spray into the nose 2 (two) times daily. Use in each nostril as directed 09/02/10   Biagio Borg, MD  Budesonide-Formoterol Fumarate (SYMBICORT IN) Inhale into the lungs.    [provider]  cetirizine (ZYRTEC) 10 MG tablet Take 10 mg by mouth daily.    [provider]  desloratadine (CLARINEX) 5 MG tablet Take 1 tablet (5 mg total) by mouth 2 (two) times daily. 09/02/10   Biagio Borg, MD    Family History Family History  Problem Relation Age of Onset  . Lung cancer Mother   . Melanoma Mother   . Diabetes Maternal Grandmother     Social History Social History   Tobacco Use  . Smoking status: Former Research scientist (life sciences)  . Smokeless tobacco: Never Used  Substance Use Topics  . Alcohol use: Yes    Comment: Drinks daily  . Drug use: No     Allergies  Other   Review of Systems Review of Systems  All other systems reviewed and are negative.    Physical Exam Triage Vital Signs ED Triage Vitals  Enc Vitals Group     BP 04/16/17 1455 126/75     Pulse Rate 04/16/17 1455 78     Resp --      Temp 04/16/17 1455 (!) 97.5 F (36.4 C)     Temp Source 04/16/17 1455 Oral     SpO2 04/16/17 1455 100 %     Weight --      Height --      Pain Score 04/16/17 1449 4     Pain Loc --    Updated Vital Signs BP 126/75 (BP Location: Left Arm)   Pulse 78   Temp (!) 97.5 F (36.4 C) (Oral)   SpO2 100%   Physical Exam  Constitutional: She is oriented  to person, place, and time. No distress.  Alert, nicely groomed  HENT:  Head: Atraumatic.  B TMs mildly dull, no erythema Mod severe nasal congestion bilat Slightly injected throat  Eyes:  Conjugate gaze, no eye redness/drainage  Neck: Neck supple.  Cardiovascular: Regular rhythm.  Heart rate 110s  Pulmonary/Chest: No respiratory distress. She has no wheezes. She has no rales.  Breath sounds diminished but symmetric throughout Frequent coughing during exam  Abdominal: She exhibits no distension.  Musculoskeletal: Normal range of motion.  No leg swelling  Neurological: She is alert and oriented to person, place, and time.  Skin: Skin is warm and dry.  No cyanosis  Nursing note and vitals reviewed.    UC Treatments / Results   Procedures Procedures (including critical care time)  Medications Ordered in UC Medications  methylPREDNISolone sodium succinate (SOLU-MEDROL) 125 mg/2 mL injection 125 mg (125 mg Intramuscular Given 04/16/17 1517)    Final Clinical Impressions(s) / UC Diagnoses   Final diagnoses:  Exacerbation of asthma, unspecified asthma severity, unspecified whether persistent  Upper respiratory tract infection, unspecified type   Anticipate gradual improvement in cough and chest tightness, breathlessness, over the next few days.  Cough may take a couple weeks to subside.  Injection of Solu-Medrol, steroid, was given at the urgent care today.  Push fluids and rest.  Recheck or followup with your primary care provider for new fever >100.5, increasing phlegm production/nasal discharge, or if not starting to improve in a few days.       Wynona Luna, MD 04/17/17 2152

## 2017-04-16 NOTE — Discharge Instructions (Addendum)
Anticipate gradual improvement in cough and chest tightness, breathlessness, over the next few days.  Cough may take a couple weeks to subside.  Injection of Solu-Medrol, steroid, was given at the urgent care today.  Push fluids and rest.  Recheck or followup with your primary care provider for new fever >100.5, increasing phlegm production/nasal discharge, or if not starting to improve in a few days.

## 2017-04-16 NOTE — ED Triage Notes (Addendum)
Green mucus, nasal congestion, per pt she has asthma, cough, itching in center of chest,

## 2017-04-21 ENCOUNTER — Other Ambulatory Visit: Payer: BLUE CROSS/BLUE SHIELD | Admitting: Orthotics

## 2017-04-21 ENCOUNTER — Ambulatory Visit (INDEPENDENT_AMBULATORY_CARE_PROVIDER_SITE_OTHER): Payer: Self-pay | Admitting: Podiatry

## 2017-04-21 ENCOUNTER — Telehealth: Payer: Self-pay | Admitting: *Deleted

## 2017-04-21 DIAGNOSIS — M2022 Hallux rigidus, left foot: Secondary | ICD-10-CM

## 2017-04-21 NOTE — Telephone Encounter (Signed)
I cannot make her go but do recommend it. She didn't go after the surgery either.

## 2017-04-21 NOTE — Progress Notes (Addendum)
Patient came in today to pick up custom made foot orthotics.  The goals were accomplished and the patient reported no dissatisfaction with said orthotics.  Patient was advised of breakin period and how to report any issues.  Patient was advised since she rquested dress orthotic to fit into dress flats, taht the arch will not hug as much as if it were to got into a tennis shoe.   ----------------  When she came to pick up orthotics I did stop in to see her.  She states that she still gets bruising and swelling she states is been getting worse.  Might look at her foot she still has swelling on the first interspace and tenderness the interspaces there is no area pinpoint tenderness.  Given her ongoing pain will order MRI to evaluate the fusion site.  Trula Slade DPM

## 2017-04-21 NOTE — Telephone Encounter (Signed)
[name] - [facility] states pt has hx of not attending appts or cancelling for other cases, current evaluation was scheduled for 04/20/2017 pt was a NO SHOW for this appt, and rescheduled for 05/09/2017 when rx expires.

## 2017-04-24 NOTE — Telephone Encounter (Signed)
-----   Message from Trula Slade, DPM sent at 04/24/2017  7:13 AM EST ----- Can you please order an MRI of her left foot?  She had a previous MTPJ fusion and was having issues so I took the hardware out but she continues to have pain and swelling to the joint.

## 2017-04-24 NOTE — Addendum Note (Signed)
Addended by: Celesta Gentile R on: 04/24/2017 07:12 AM   Modules accepted: Level of Service

## 2017-04-24 NOTE — Telephone Encounter (Signed)
Orders given to J. Quintana, RN for pre-cert, and faxed to [facility]. 

## 2017-05-08 ENCOUNTER — Telehealth: Payer: Self-pay | Admitting: Podiatry

## 2017-05-08 NOTE — Telephone Encounter (Signed)
Dr. Jacqualyn Posey had someone call me for a referral to an MRI on my foot and their message was garbled and I don't know who they are, so I need to have that information again. Also, I finally was back in town and rescheduled my physical therapy but they need a new prescription since it has been since January. So if you could provide that to them, I would greatly appreciate it. My number is [phone].

## 2017-05-08 NOTE — Telephone Encounter (Signed)
Left message informing pt our office had not called, but I felt the [facility] had called and she could call to schedule [phone].

## 2017-05-19 ENCOUNTER — Telehealth: Payer: Self-pay | Admitting: *Deleted

## 2017-05-19 ENCOUNTER — Ambulatory Visit
Admission: RE | Admit: 2017-05-19 | Discharge: 2017-05-19 | Disposition: A | Discharge status: Home / Self Care | Payer: BLUE CROSS/BLUE SHIELD | Source: Ambulatory Visit | Attending: Podiatry | Admitting: Podiatry

## 2017-05-19 NOTE — Telephone Encounter (Signed)
I tried to call, no answer.

## 2017-05-19 NOTE — Telephone Encounter (Signed)
Pt states she had the MRI performed this morning and wanted to know the results.

## 2017-05-22 NOTE — Telephone Encounter (Signed)
Left message informing Dr. Jacqualyn Posey had advised of the MRI result and to call to discuss.

## 2017-05-22 NOTE — Telephone Encounter (Signed)
Please let her know that the MRI shoes some mild arthritis in the actual toe but the fusion is stable.

## 2017-05-23 ENCOUNTER — Encounter: Payer: Self-pay | Admitting: Podiatry

## 2017-05-23 ENCOUNTER — Ambulatory Visit: Payer: BLUE CROSS/BLUE SHIELD | Admitting: Podiatry

## 2017-05-23 DIAGNOSIS — M779 Enthesopathy, unspecified: Secondary | ICD-10-CM | POA: Diagnosis not present

## 2017-05-23 DIAGNOSIS — M2022 Hallux rigidus, left foot: Secondary | ICD-10-CM | POA: Diagnosis not present

## 2017-05-23 NOTE — Telephone Encounter (Signed)
I informed pt of Dr. Leigh Aurora review of the MRI results.

## 2017-05-24 ENCOUNTER — Other Ambulatory Visit: Payer: BLUE CROSS/BLUE SHIELD | Admitting: Orthotics

## 2017-05-25 ENCOUNTER — Other Ambulatory Visit: Payer: BLUE CROSS/BLUE SHIELD | Admitting: Orthotics

## 2017-05-25 NOTE — Progress Notes (Signed)
Subjective: [name] presents the office today for follow-up evaluation to discuss MRI results of her left foot.  She states that she continues to get swelling as well as some mild bruising on the interspace where she points.  She denies any recent injury or trauma.  She states this is been ongoing since her original surgery.  She did not do physical therapy.  She is been wearing orthotics but she is not sure if they are fitting correctly.  She would like to have them adjusted. Denies any systemic complaints such as fevers, chills, nausea, vomiting. No acute changes since last appointment, and no other complaints at this time.   Objective: AAO x3, NAD DP/PT pulses palpable bilaterally, CRT less than 3 seconds Arthrodesis site appears to be stable to the left foot.  There does appear to be chronic swelling on the first interspace but there is no significant bruising identified today.  There is no erythema or increase in warmth.  There is no area pinpoint bony tenderness or pain to vibratory sensation.  Minimal discomfort of the first interspace. No open lesions or pre-ulcerative lesions.  No pain with calf compression, swelling, warmth, erythema  Assessment: 62 year old female first interspace pain, tendinitis  Plan: -All treatment options discussed with the patient including all alternatives, risks, complications.  -MRI results were discussed with the patient.  No acute abnormality present, mild to moderate osteoarthritis of the IP joint of the hallux, solid MPJ arthrodesis. At this point I would not recommend reversing the arthrodesis and putting and plan thanks I do not think it can help with her swelling she does not actually have pain to the arthrodesis site.  I discussed a steroid injection of the first interspace and she wished to proceed with this.  Skin was prepped with alcohol and a mixture of 1 cc of Kenalog 10, 0.5 cc of 0.5% Marcaine plain 0.5 cc of 1% lidocaine plain was  infiltrated into the first interspace without complications.  Postinjection care was discussed.  Months for infection. -She is to come back to see because she was having orthotics modified in view of full length orthotic. -Patient encouraged to call the office with any questions, concerns, change in symptoms.   Trula Slade DPM

## 2017-05-29 ENCOUNTER — Other Ambulatory Visit: Payer: BLUE CROSS/BLUE SHIELD | Admitting: Orthotics

## 2017-06-05 ENCOUNTER — Other Ambulatory Visit: Payer: BLUE CROSS/BLUE SHIELD | Admitting: Orthotics

## 2017-08-28 ENCOUNTER — Telehealth: Payer: Self-pay | Admitting: Podiatry

## 2017-08-28 NOTE — Telephone Encounter (Signed)
This is Daphine Loch at [phone] if someone could call me back please. I recently had an MRI done and I need that sent to my doctor at the [facility]. I can actually give you their phone number and fax number. Their phone number is [phone] and the fax number is [phone]. If you need some type of approval from me, you can maybe e-mail me an approval that I could sign and send back. My e-mail is [email or web address]

## 2017-12-19 IMAGING — DX DG FOOT COMPLETE 3+V*L*
3 series · 3 of 3 positions shown · non-contrast
Comparison: 02/25/2016

CLINICAL DATA: Hallux rigidus, surgery 5 weeks ago.

EXAM:
LEFT FOOT - COMPLETE 3+ VIEW

[foot ap]
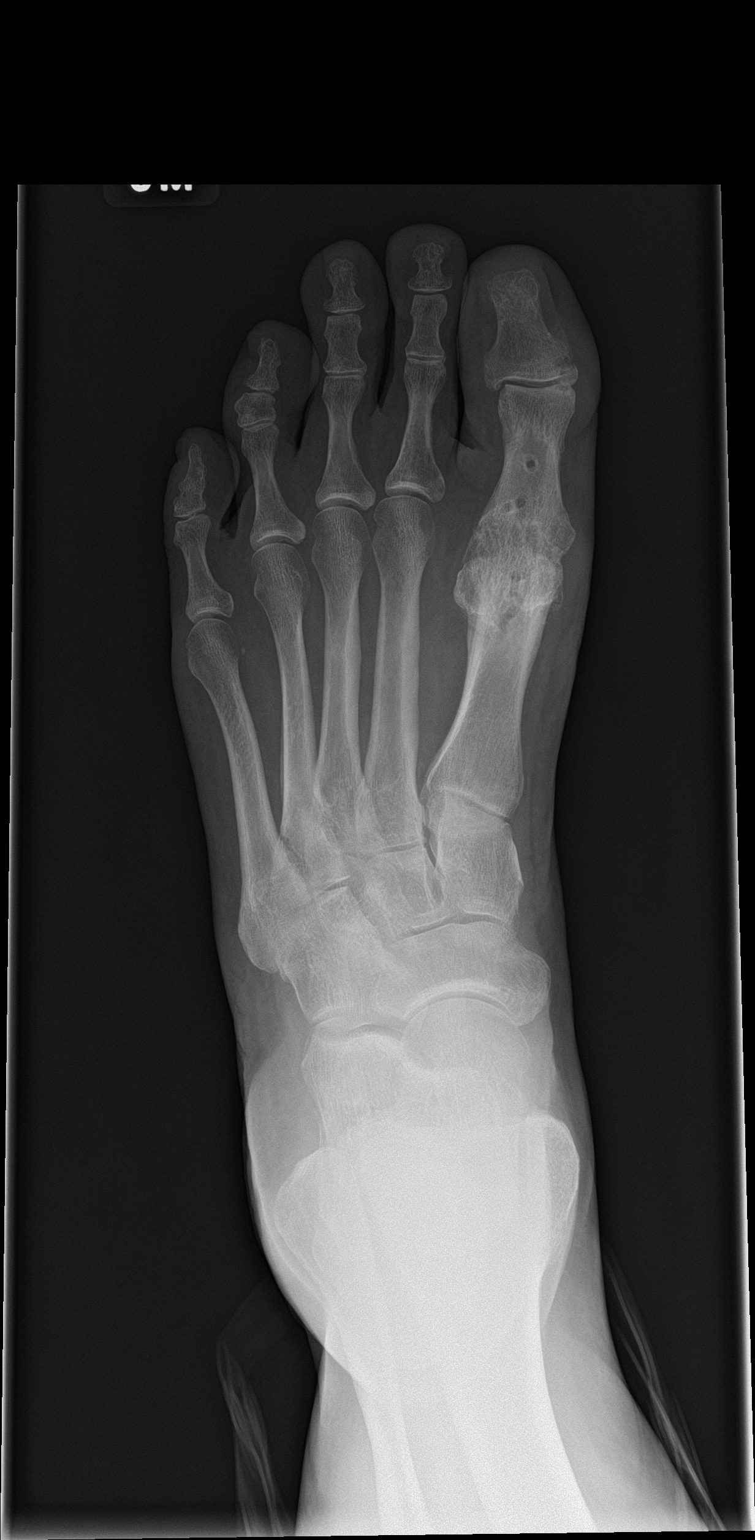

[foot obl]
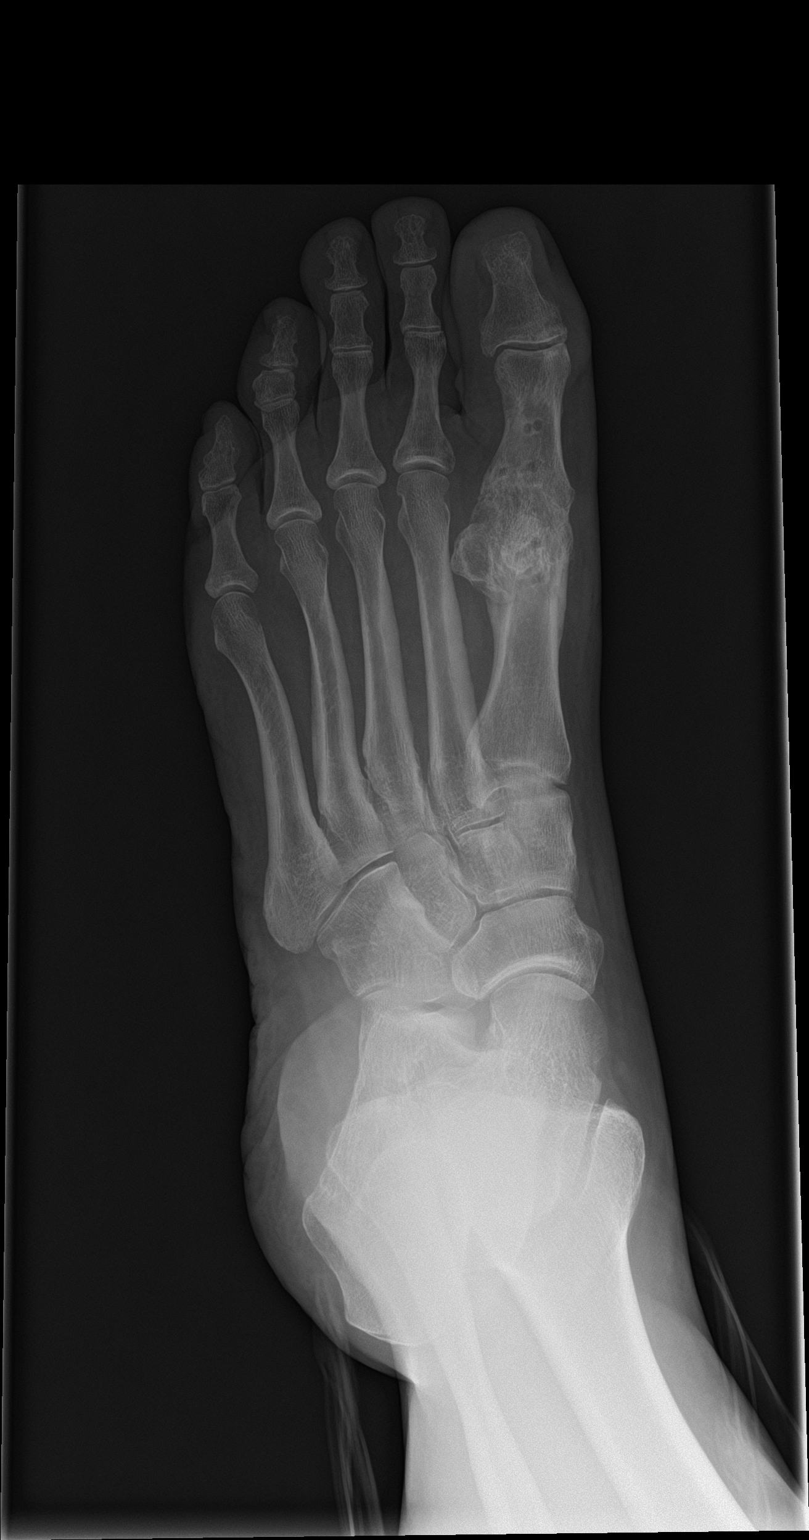

[foot lat]
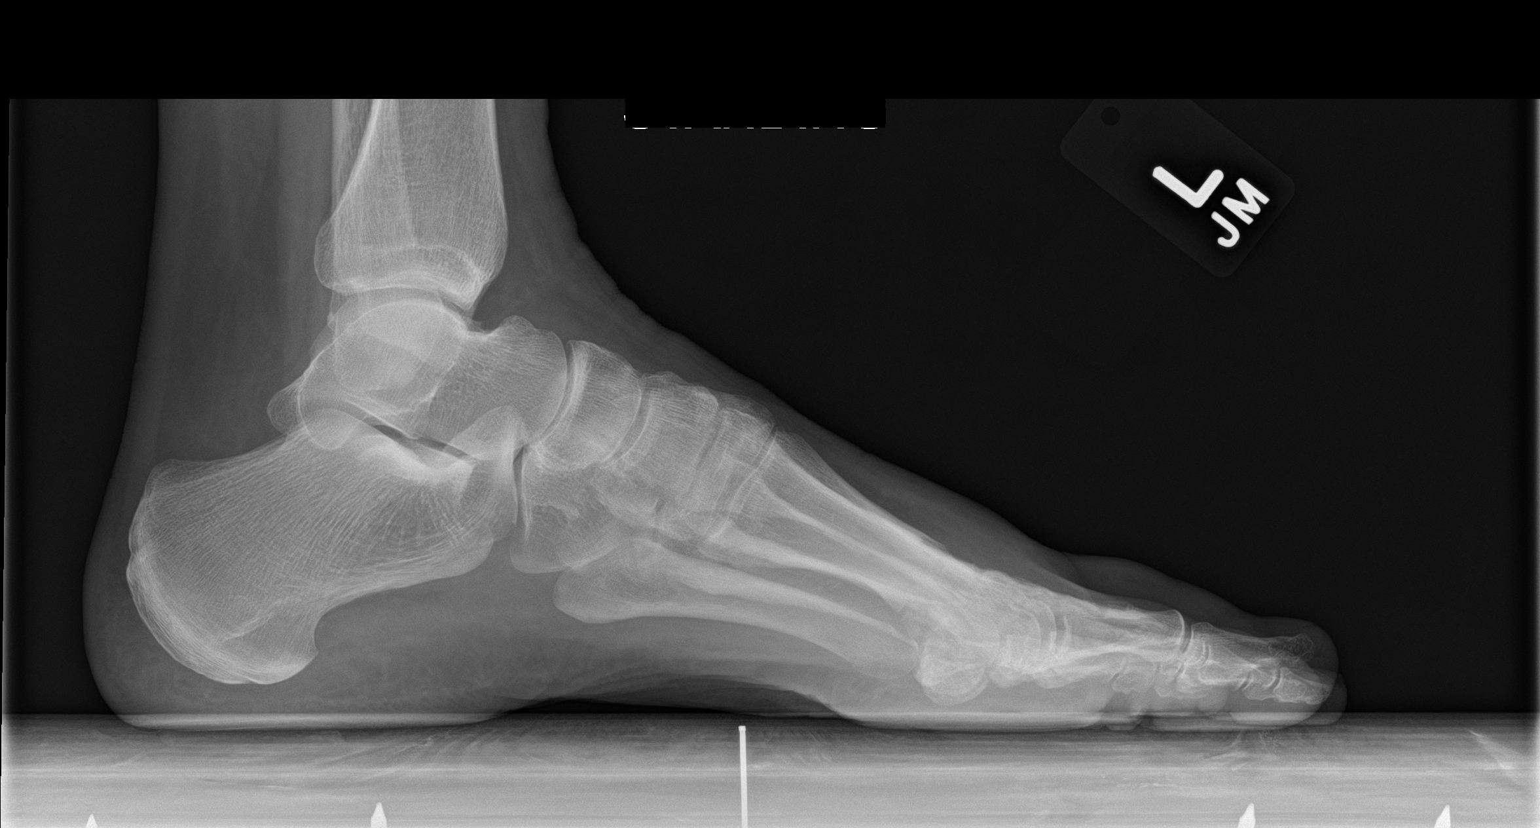

[3 of 3 positions shown; findings below may reference images not displayed]

FINDINGS: Arthrodesis of the first MTP joint with solid osseous fusion across
the joint space. No acute fracture or dislocation. No bone
destruction or periosteal reaction. No soft tissue abnormality.
IMPRESSION: Solid arthrodesis across the first MTP joint.

## 2019-03-01 IMAGING — MR MR FOOT*L* W/O CM
4 of 5 series · 21 of 40 positions shown · non-contrast
Comparison: Plain films left foot 03/17/2017 03/08/2016.

CLINICAL DATA: Chronic great toe pain and bruising. History of
prior surgery x2

EXAM:
MRI OF THE LEFT FOOT WITHOUT CONTRAST
TECHNIQUE: Multiplanar, multisequence MR imaging of the left foot was
performed. No intravenous contrast was administered.

[Series 3: T2 fat-sat · coronal · 4.0mm · 0.23mm/px · 8 of 27 slices shown (1 of 3)]
[im 1/27]
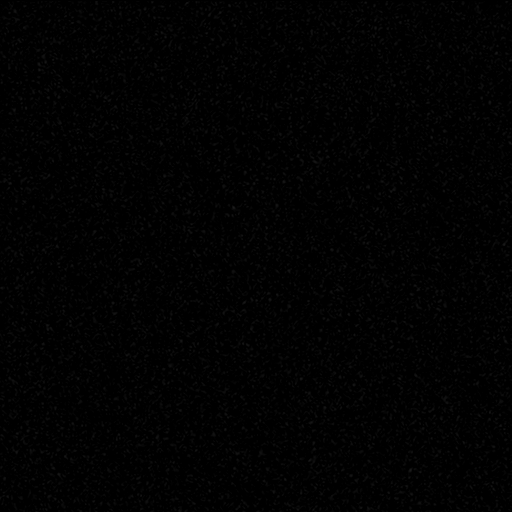
[im 4/27]
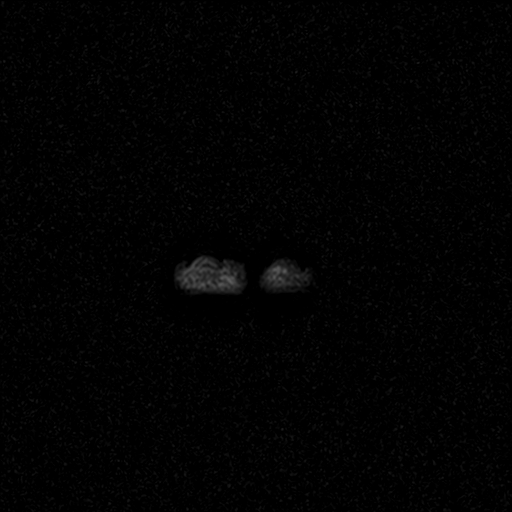
[im 8/27]
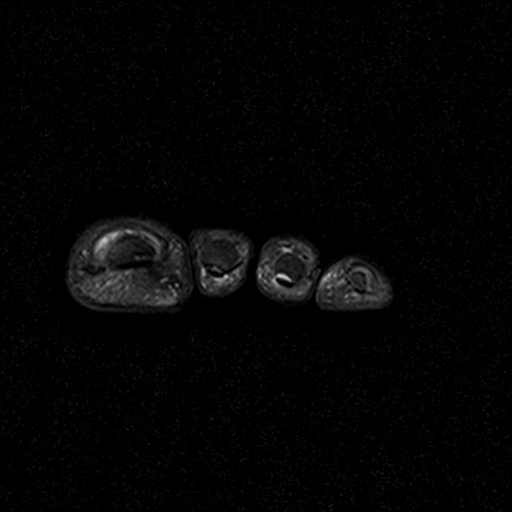
[im 12/27]
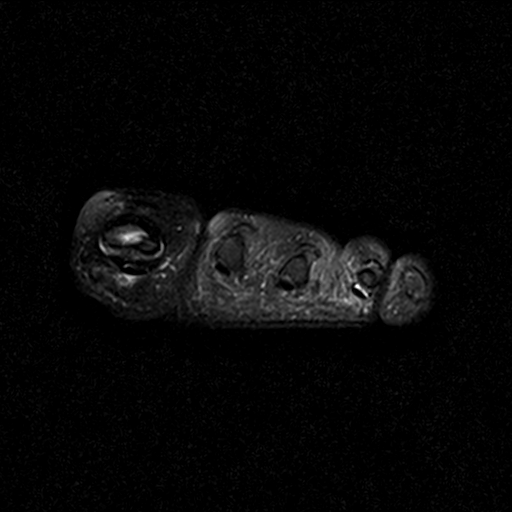
[im 15/27]
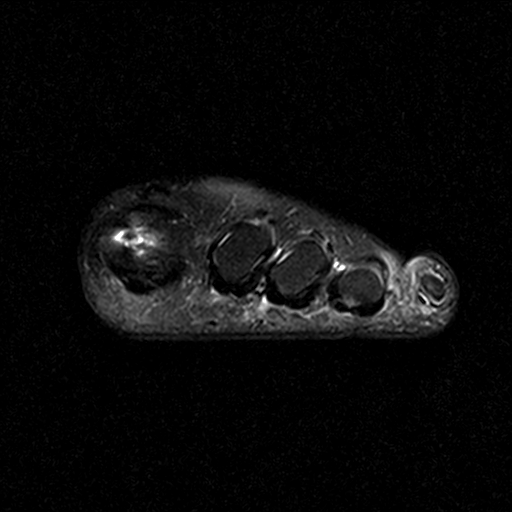
[im 19/27]
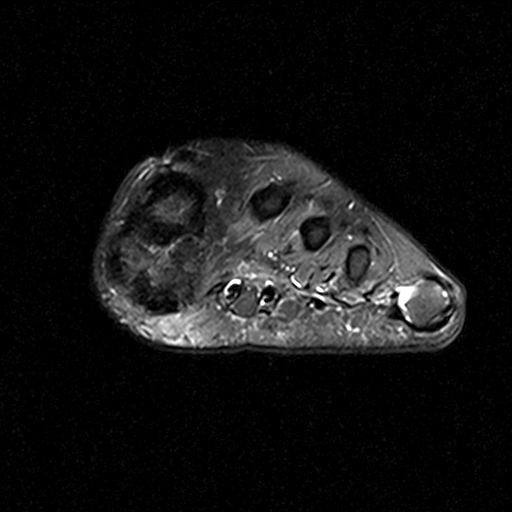
[im 23/27]
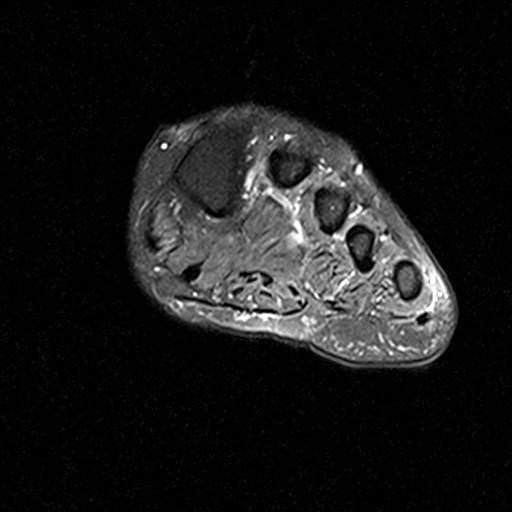
[im 27/27]
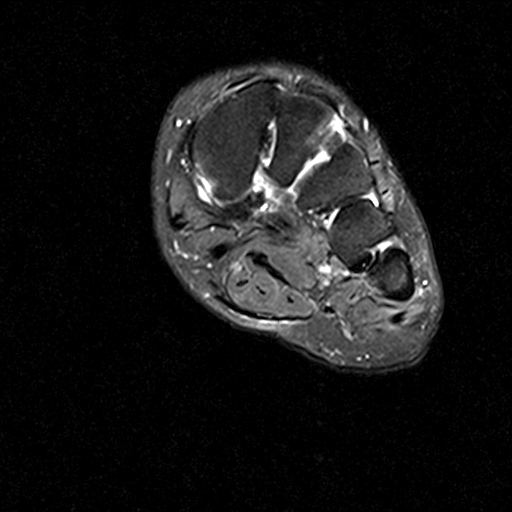

[Series 4: T1 · coronal · 4.0mm · 0.47mm/px · 3 of 27 slices shown]
[im 4/27]
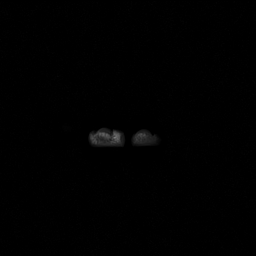
[im 15/27]
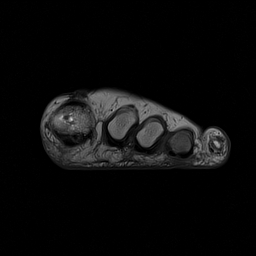
[im 23/27]
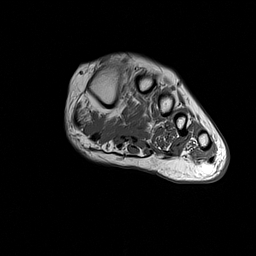

[Series 6: T2 fat-sat · axial · 2.5mm · 0.35mm/px · z∈[-49,+2]mm · 7 of 25 slices shown (2 of 3)]
[im 1/25]
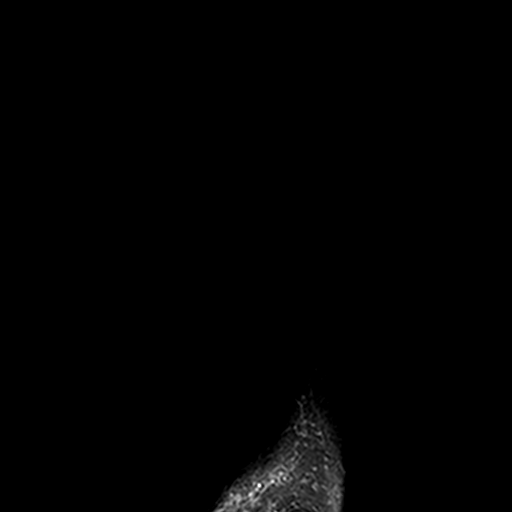
[im 4/25]
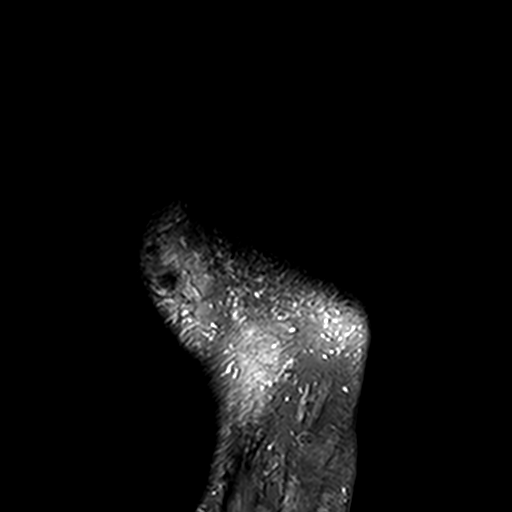
[im 7/25]
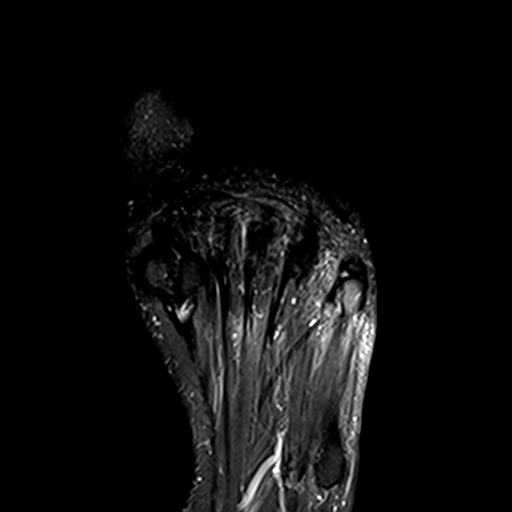
[im 11/25]
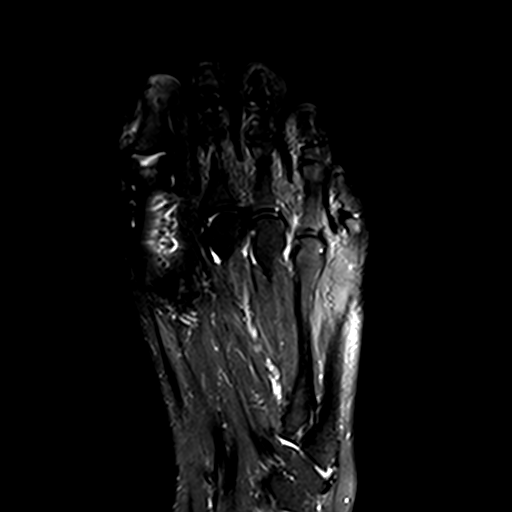
[im 14/25]
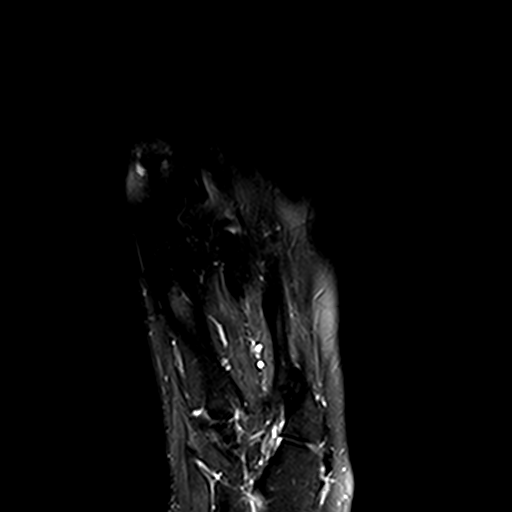
[im 18/25]
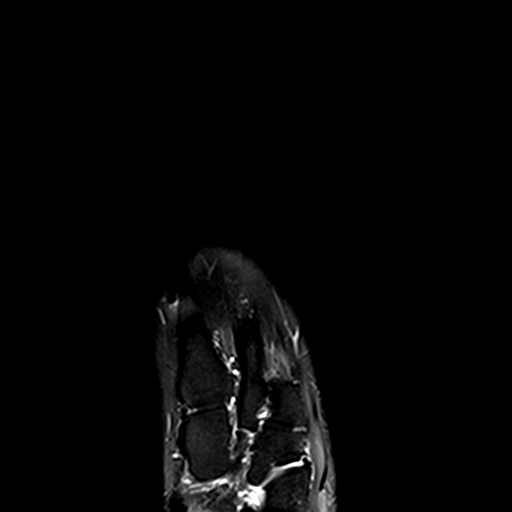
[im 21/25]
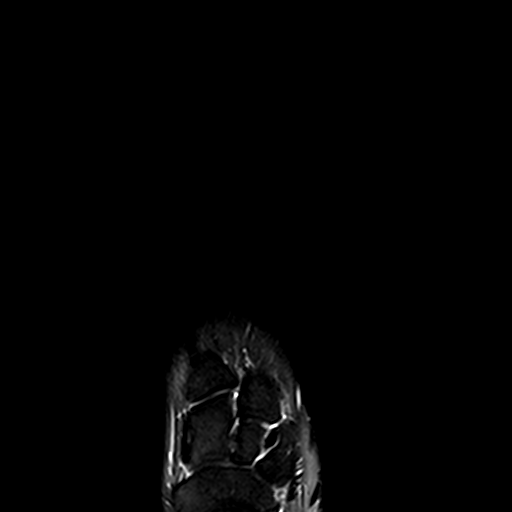

[Series 7: T2 fat-sat · sagittal · 3.0mm · 0.35mm/px · 3 of 27 slices shown (3 of 3)]
[im 4/27]
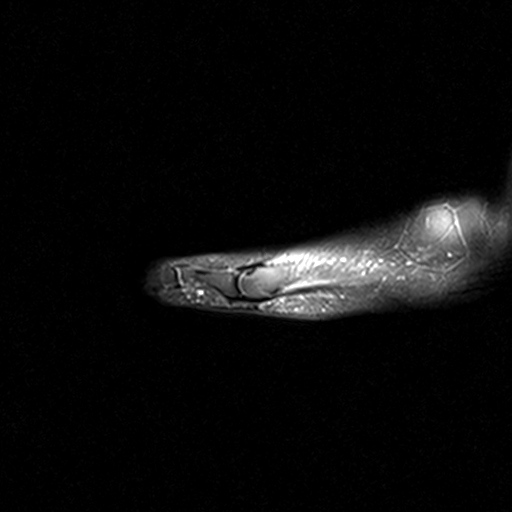
[im 15/27]
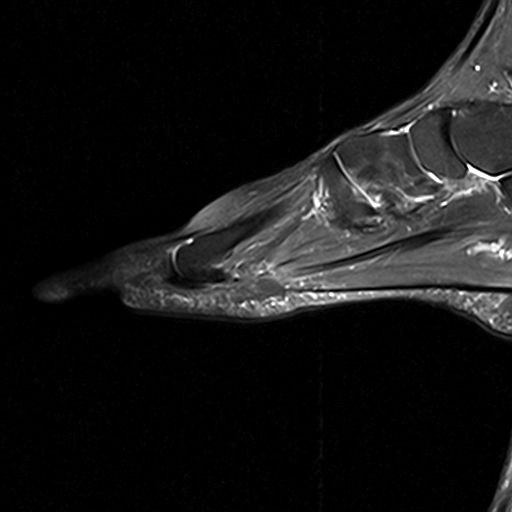
[im 23/27]
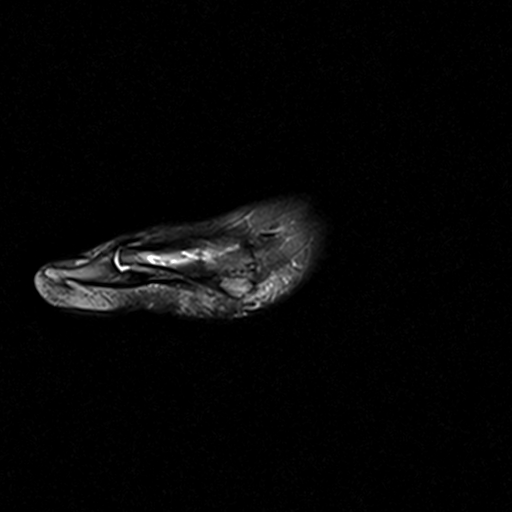

[21 of 40 positions shown; findings below may reference images not displayed]

FINDINGS: Bones/Joint/Cartilage

Solid first MTP joint fusion is identified. No fracture or focal
bony lesion is seen. Mild to moderate degenerative change is present
about the IP joint of the great toe. No other evidence of
arthropathy is seen.

Ligaments

Intact.

Muscles and Tendons

Intact and normal in appearance.

Soft tissues

Negative.
IMPRESSION: No acute abnormality.

Mild-to-moderate osteoarthritis IP joint of the great toe.

Solid first MTP arthrodesis.
